# Patient Record
Sex: Female | Born: 1970 | Race: White | Hispanic: No | Marital: Married | Smoking: Current every day smoker
Health system: Southern US, Community
[De-identification: ages and names within clinical notes are randomized; demographics above are authoritative.]

## PROBLEM LIST (undated history)

## (undated) DIAGNOSIS — Z8709 Personal history of other diseases of the respiratory system: Secondary | ICD-10-CM

## (undated) DIAGNOSIS — R531 Weakness: Secondary | ICD-10-CM

## (undated) DIAGNOSIS — M2550 Pain in unspecified joint: Secondary | ICD-10-CM

## (undated) DIAGNOSIS — M2540 Effusion, unspecified joint: Secondary | ICD-10-CM

## (undated) DIAGNOSIS — R42 Dizziness and giddiness: Secondary | ICD-10-CM

## (undated) DIAGNOSIS — F419 Anxiety disorder, unspecified: Secondary | ICD-10-CM

## (undated) DIAGNOSIS — Z9889 Other specified postprocedural states: Secondary | ICD-10-CM

## (undated) DIAGNOSIS — Z8669 Personal history of other diseases of the nervous system and sense organs: Secondary | ICD-10-CM

## (undated) DIAGNOSIS — E785 Hyperlipidemia, unspecified: Secondary | ICD-10-CM

## (undated) DIAGNOSIS — F329 Major depressive disorder, single episode, unspecified: Secondary | ICD-10-CM

## (undated) DIAGNOSIS — M549 Dorsalgia, unspecified: Secondary | ICD-10-CM

## (undated) DIAGNOSIS — F32A Depression, unspecified: Secondary | ICD-10-CM

## (undated) DIAGNOSIS — E0590 Thyrotoxicosis, unspecified without thyrotoxic crisis or storm: Secondary | ICD-10-CM

## (undated) DIAGNOSIS — R351 Nocturia: Secondary | ICD-10-CM

## (undated) DIAGNOSIS — R350 Frequency of micturition: Secondary | ICD-10-CM

## (undated) DIAGNOSIS — M1990 Unspecified osteoarthritis, unspecified site: Secondary | ICD-10-CM

## (undated) DIAGNOSIS — M797 Fibromyalgia: Secondary | ICD-10-CM

## (undated) DIAGNOSIS — R112 Nausea with vomiting, unspecified: Secondary | ICD-10-CM

## (undated) DIAGNOSIS — G8929 Other chronic pain: Secondary | ICD-10-CM

## (undated) DIAGNOSIS — R413 Other amnesia: Secondary | ICD-10-CM

## (undated) DIAGNOSIS — J189 Pneumonia, unspecified organism: Secondary | ICD-10-CM

## (undated) DIAGNOSIS — K219 Gastro-esophageal reflux disease without esophagitis: Secondary | ICD-10-CM

## (undated) DIAGNOSIS — G4700 Insomnia, unspecified: Secondary | ICD-10-CM

## (undated) HISTORY — PX: WRIST SURGERY: SHX841

## (undated) HISTORY — PX: OTHER SURGICAL HISTORY: SHX169

---

## 2005-10-28 HISTORY — PX: CHOLECYSTECTOMY: SHX55

## 2005-10-28 HISTORY — PX: ABDOMINAL HYSTERECTOMY: SHX81

## 2005-10-28 HISTORY — PX: OOPHORECTOMY: SHX86

## 2012-10-28 HISTORY — PX: BACK SURGERY: SHX140

## 2014-08-22 ENCOUNTER — Other Ambulatory Visit (HOSPITAL_COMMUNITY): Payer: Self-pay | Admitting: Neurosurgery

## 2014-08-22 DIAGNOSIS — M5126 Other intervertebral disc displacement, lumbar region: Secondary | ICD-10-CM

## 2014-09-02 ENCOUNTER — Other Ambulatory Visit (HOSPITAL_COMMUNITY): Payer: Self-pay | Admitting: Neurosurgery

## 2014-09-09 ENCOUNTER — Inpatient Hospital Stay (HOSPITAL_COMMUNITY): Admission: RE | Admit: 2014-09-09 | Payer: Self-pay | Source: Ambulatory Visit

## 2014-09-09 ENCOUNTER — Ambulatory Visit (HOSPITAL_COMMUNITY): Payer: Medicare Other

## 2014-09-28 ENCOUNTER — Ambulatory Visit (HOSPITAL_COMMUNITY)
Admission: RE | Admit: 2014-09-28 | Discharge: 2014-09-28 | Disposition: A | Discharge status: Home / Self Care | Payer: Medicare Other | Source: Ambulatory Visit | Attending: Neurosurgery | Admitting: Neurosurgery

## 2014-09-28 ENCOUNTER — Encounter (HOSPITAL_COMMUNITY): Payer: Self-pay | Admitting: Pharmacy Technician

## 2014-09-28 VITALS — BP 107/61 | HR 56 | Temp 97.6°F | Resp 16 | Ht 62.0 in | Wt 158.0 lb

## 2014-09-28 DIAGNOSIS — M5126 Other intervertebral disc displacement, lumbar region: Secondary | ICD-10-CM | POA: Diagnosis present

## 2014-09-28 DIAGNOSIS — M5441 Lumbago with sciatica, right side: Secondary | ICD-10-CM

## 2014-09-28 DIAGNOSIS — M549 Dorsalgia, unspecified: Secondary | ICD-10-CM | POA: Diagnosis present

## 2014-09-28 DIAGNOSIS — M5442 Lumbago with sciatica, left side: Secondary | ICD-10-CM

## 2014-09-28 MED ORDER — DIAZEPAM 5 MG PO TABS
10.0000 mg | ORAL_TABLET | Freq: Once | ORAL | Status: AC
  Administered 2014-09-28: 10 mg via ORAL

## 2014-09-28 MED ORDER — ACETAMINOPHEN 325 MG PO TABS
ORAL_TABLET | ORAL | Status: AC
  Filled 2014-09-28: qty 2

## 2014-09-28 MED ORDER — ACETAMINOPHEN 325 MG PO TABS
650.0000 mg | ORAL_TABLET | Freq: Once | ORAL | Status: AC
  Administered 2014-09-28: 650 mg via ORAL

## 2014-09-28 MED ORDER — IOHEXOL 180 MG/ML  SOLN
20.0000 mL | Freq: Once | INTRAMUSCULAR | Status: AC | PRN
  Administered 2014-09-28: 20 mL via INTRATHECAL

## 2014-09-28 MED ORDER — TRAMADOL HCL 50 MG PO TABS
50.0000 mg | ORAL_TABLET | ORAL | Status: DC | PRN
  Administered 2014-09-28: 100 mg via ORAL
  Filled 2014-09-28 (×2): qty 2

## 2014-09-28 MED ORDER — ONDANSETRON HCL 4 MG/2ML IJ SOLN: 4.0000 mg | Freq: Four times a day (QID) | INTRAMUSCULAR | Status: DC | PRN

## 2014-09-28 MED ORDER — DIAZEPAM 5 MG PO TABS
ORAL_TABLET | ORAL | Status: AC
  Administered 2014-09-28: 10 mg via ORAL
  Filled 2014-09-28: qty 2

## 2014-09-28 NOTE — Op Note (Signed)
09/28/2014 Lumbar Myelogram  PATIENT:  Ebony HailJulie A Rudge is a 43 y.o. female  PRE-OPERATIVE DIAGNOSIS:  lumbago  POST-OPERATIVE DIAGNOSIS:  lumbago  PROCEDURE:  Lumbar Myelogram  SURGEON:  Arlene Brickel  ANESTHESIA:   local LOCAL MEDICATIONS USED:  LIDOCAINE  and Amount: 8 ml Procedure Note: Ebony HailJulie A Elsayed is a 43 y.o. female Was taken to the fluoroscopy suite and  positioned prone on the fluoroscopy table. Her back was prepared and draped in a sterile manner. I infiltrated 8 cc into the lumbar region. I then introduced a spinal needle into the thecal sac at the L4/5 interlaminar space. I infiltrated 20cc of Omnipaque 180 into the thecal sac. Fluoroscopy showed the needle and contrast in the thecal sac. Zenaida DeedJulie A Francom tolerated the procedure well. she Will be taken to CT for evaluation.     PATIENT DISPOSITION:  PACU - hemodynamically stable.

## 2014-09-28 NOTE — Discharge Instructions (Signed)
Myelography, Care After °These instructions give you information on caring for yourself after your procedure. Your doctor may also give you specific instructions. Call your doctor if you have any problems or questions after your procedure. °HOME CARE °· Rest often the first day. °· When you rest, lie flat, with your head slightly raised (elevated). °· Avoid heavy lifting and activity for 48 hours. °· You may take the bandage (dressing) off 1 day after the test. °GET HELP RIGHT AWAY IF:  °· You have a very bad headache. °· You have a fever. °MAKE SURE YOU: °· Understand these instructions. °· Will watch your condition. °· Will get help right away if you are not doing well or get worse. °Document Released: 07/23/2008 Document Revised: 02/28/2014 Document Reviewed: 02/02/2014 °ExitCare® Patient Information ©2015 [employer]. This information is not intended to replace advice given to you by your health care provider. Make sure you discuss any questions you have with your health care provider. ° ° °

## 2014-10-24 ENCOUNTER — Other Ambulatory Visit (HOSPITAL_COMMUNITY): Payer: Self-pay | Admitting: Neurosurgery

## 2014-11-10 ENCOUNTER — Encounter (HOSPITAL_COMMUNITY)
Admission: RE | Admit: 2014-11-10 | Discharge: 2014-11-10 | Disposition: A | Discharge status: Home / Self Care | Payer: Medicare Other | Source: Ambulatory Visit | Attending: Neurosurgery | Admitting: Neurosurgery

## 2014-11-10 ENCOUNTER — Encounter (HOSPITAL_COMMUNITY): Payer: Self-pay

## 2014-11-10 DIAGNOSIS — Z01812 Encounter for preprocedural laboratory examination: Secondary | ICD-10-CM | POA: Insufficient documentation

## 2014-11-10 DIAGNOSIS — M4806 Spinal stenosis, lumbar region: Secondary | ICD-10-CM | POA: Diagnosis not present

## 2014-11-10 DIAGNOSIS — Z01818 Encounter for other preprocedural examination: Secondary | ICD-10-CM | POA: Insufficient documentation

## 2014-11-10 HISTORY — DX: Pain in unspecified joint: M25.50

## 2014-11-10 HISTORY — DX: Frequency of micturition: R35.0

## 2014-11-10 HISTORY — DX: Other amnesia: R41.3

## 2014-11-10 HISTORY — DX: Personal history of other diseases of the respiratory system: Z87.09

## 2014-11-10 HISTORY — DX: Other specified postprocedural states: Z98.890

## 2014-11-10 HISTORY — DX: Effusion, unspecified joint: M25.40

## 2014-11-10 HISTORY — DX: Other chronic pain: G89.29

## 2014-11-10 HISTORY — DX: Dizziness and giddiness: R42

## 2014-11-10 HISTORY — DX: Nausea with vomiting, unspecified: R11.2

## 2014-11-10 HISTORY — DX: Insomnia, unspecified: G47.00

## 2014-11-10 HISTORY — DX: Dorsalgia, unspecified: M54.9

## 2014-11-10 HISTORY — DX: Thyrotoxicosis, unspecified without thyrotoxic crisis or storm: E05.90

## 2014-11-10 HISTORY — DX: Weakness: R53.1

## 2014-11-10 HISTORY — DX: Depression, unspecified: F32.A

## 2014-11-10 HISTORY — DX: Major depressive disorder, single episode, unspecified: F32.9

## 2014-11-10 HISTORY — DX: Pneumonia, unspecified organism: J18.9

## 2014-11-10 HISTORY — DX: Fibromyalgia: M79.7

## 2014-11-10 HISTORY — DX: Gastro-esophageal reflux disease without esophagitis: K21.9

## 2014-11-10 HISTORY — DX: Hyperlipidemia, unspecified: E78.5

## 2014-11-10 HISTORY — DX: Unspecified osteoarthritis, unspecified site: M19.90

## 2014-11-10 HISTORY — DX: Anxiety disorder, unspecified: F41.9

## 2014-11-10 HISTORY — DX: Personal history of other diseases of the nervous system and sense organs: Z86.69

## 2014-11-10 HISTORY — DX: Nocturia: R35.1

## 2014-11-10 LAB — BASIC METABOLIC PANEL
Anion gap: 5 (ref 5–15)
BUN: <5 mg/dL — ABNORMAL LOW (ref 6–23)
CALCIUM: 8.9 mg/dL (ref 8.4–10.5)
CO2: 23 mmol/L (ref 19–32)
CREATININE: 0.9 mg/dL (ref 0.50–1.10)
Chloride: 110 mEq/L (ref 96–112)
GFR calc Af Amer: 89 mL/min — ABNORMAL LOW (ref >90)
GFR calc non Af Amer: 77 mL/min — ABNORMAL LOW (ref >90)
GLUCOSE: 107 mg/dL — AB (ref 70–99)
Potassium: 4 mmol/L (ref 3.5–5.1)
Sodium: 138 mmol/L (ref 135–145)

## 2014-11-10 LAB — CBC
HEMATOCRIT: 42.1 % (ref 36.0–46.0)
Hemoglobin: 14 g/dL (ref 12.0–15.0)
MCH: 27.3 pg (ref 26.0–34.0)
MCHC: 33.3 g/dL (ref 30.0–36.0)
MCV: 82.2 fL (ref 78.0–100.0)
Platelets: 333 10*3/uL (ref 150–400)
RBC: 5.12 MIL/uL — AB (ref 3.87–5.11)
RDW: 14.3 % (ref 11.5–15.5)
WBC: 11.9 10*3/uL — ABNORMAL HIGH (ref 4.0–10.5)

## 2014-11-10 LAB — SURGICAL PCR SCREEN
MRSA, PCR: NEGATIVE
Staphylococcus aureus: NEGATIVE

## 2014-11-10 NOTE — Pre-Procedure Instructions (Signed)
Ebony HailJulie A Hornak  11/10/2014   Your procedure is scheduled on:  Thurs, Jan 21  @ 1:20 PM  [facility]  at 10:15 AM.  Call this number if you have problems the morning of surgery: [phone]   Remember:   Do not eat food or drink liquids after midnight.   Take these medicines the morning of surgery with A SIP OF WATER: Citalopram(Celexa),Valium(Diazepam),Nexium(Esomeprazole),and Requip(Ropinirole)              Stop taking your Ibuprofen a week before surgery. No Goody's,BC's,Aleve,Aspirin,Fish Oil,or any Herbal Medications   Do not wear jewelry, make-up or nail polish.  Do not wear lotions, powders, or perfumes. You may wear deodorant.  Do not shave 48 hours prior to surgery.   Do not bring valuables to the hospital.  [facility] is not responsible                  for any belongings or valuables.               Contacts, dentures or bridgework may not be worn into surgery.  Leave suitcase in the car. After surgery it may be brought to your room.  For patients admitted to the hospital, discharge time is determined by your                treatment team.               Patients discharged the day of surgery will not be allowed to drive  home.    Special Instructions:  [facility] - Preparing for Surgery  Before surgery, you can play an important role.  Because skin is not sterile, your skin needs to be as free of germs as possible.  You can reduce the number of germs on you skin by washing with CHG (chlorahexidine gluconate) soap before surgery.  CHG is an antiseptic cleaner which kills germs and bonds with the skin to continue killing germs even after washing.  Please DO NOT use if you have an allergy to CHG or antibacterial soaps.  If your skin becomes reddened/irritated stop using the CHG and inform your nurse when you arrive at Short Stay.  Do not shave (including legs and underarms) for at least 48 hours prior to the first CHG shower.  You may shave your  face.  Please follow these instructions carefully:   1.  Shower with CHG Soap the night before surgery and the                                morning of Surgery.  2.  If you choose to wash your hair, wash your hair first as usual with your       normal shampoo.  3.  After you shampoo, rinse your hair and body thoroughly to remove the                      Shampoo.  4.  Use CHG as you would any other liquid soap.  You can apply chg directly       to the skin and wash gently with scrungie or a clean washcloth.  5.  Apply the CHG Soap to your body ONLY FROM THE NECK DOWN.        Do not use on open wounds or open sores.  Avoid contact with your eyes,  ears, mouth and genitals (private parts).  Wash genitals (private parts)       with your normal soap.  6.  Wash thoroughly, paying special attention to the area where your surgery        will be performed.  7.  Thoroughly rinse your body with warm water from the neck down.  8.  DO NOT shower/wash with your normal soap after using and rinsing off       the CHG Soap.  9.  Pat yourself dry with a clean towel.            10.  Wear clean pajamas.            11.  Place clean sheets on your bed the night of your first shower and do not        sleep with pets.  Day of Surgery  Do not apply any lotions/deoderants the morning of surgery.  Please wear clean clothes to the hospital/surgery center.     Please read over the following fact sheets that you were given: Pain Booklet, Coughing and Deep Breathing, MRSA Information and Surgical Site Infection Prevention

## 2014-11-10 NOTE — Progress Notes (Signed)
Pt doesn't have a cardiologist  Stress test in 2008-d/t anxiety/panic attacks  Denies ever having an echo or heart cath  [facility] [facility]  Denies EKG or CXR in past yr

## 2014-11-16 MED ORDER — CEFAZOLIN SODIUM-DEXTROSE 2-3 GM-% IV SOLR
2.0000 g | INTRAVENOUS | Status: AC
  Administered 2014-11-17: 2 g via INTRAVENOUS
  Filled 2014-11-16: qty 50

## 2014-11-16 NOTE — Progress Notes (Signed)
Patient called Short Stay and voiced concern about medications she was instructed to take DOS. Patient stated "I am suppose to take these medicines but if I don't take them with food shortly after then I will get sick. So should I just take the medicines any way?" Nurse reviewed medications with patient and informed patient that if the medications were going to make her sick then she should only take the Nexium the DOS. Patient stated "yes I definitely have to take the Nexium if I don't take anything else." Patient thanked Nurse for help. Call ended.

## 2014-11-17 ENCOUNTER — Encounter (HOSPITAL_COMMUNITY): Payer: Self-pay | Admitting: Anesthesiology

## 2014-11-17 ENCOUNTER — Inpatient Hospital Stay (HOSPITAL_COMMUNITY): Payer: Medicare Other | Admitting: Anesthesiology

## 2014-11-17 ENCOUNTER — Ambulatory Visit (HOSPITAL_COMMUNITY)
Admission: RE | Admit: 2014-11-17 | Discharge: 2014-11-18 | Disposition: A | Discharge status: Home / Self Care | Payer: Medicare Other | Source: Ambulatory Visit | Attending: Neurosurgery | Admitting: Neurosurgery

## 2014-11-17 ENCOUNTER — Encounter (HOSPITAL_COMMUNITY)
Admission: RE | Disposition: A | Discharge status: Home / Self Care | Payer: Self-pay | Source: Ambulatory Visit | Attending: Neurosurgery

## 2014-11-17 ENCOUNTER — Inpatient Hospital Stay (HOSPITAL_COMMUNITY): Payer: Medicare Other

## 2014-11-17 DIAGNOSIS — K219 Gastro-esophageal reflux disease without esophagitis: Secondary | ICD-10-CM | POA: Insufficient documentation

## 2014-11-17 DIAGNOSIS — F419 Anxiety disorder, unspecified: Secondary | ICD-10-CM | POA: Insufficient documentation

## 2014-11-17 DIAGNOSIS — Z885 Allergy status to narcotic agent status: Secondary | ICD-10-CM | POA: Diagnosis not present

## 2014-11-17 DIAGNOSIS — M1990 Unspecified osteoarthritis, unspecified site: Secondary | ICD-10-CM | POA: Diagnosis not present

## 2014-11-17 DIAGNOSIS — E119 Type 2 diabetes mellitus without complications: Secondary | ICD-10-CM | POA: Insufficient documentation

## 2014-11-17 DIAGNOSIS — E0590 Thyrotoxicosis, unspecified without thyrotoxic crisis or storm: Secondary | ICD-10-CM | POA: Insufficient documentation

## 2014-11-17 DIAGNOSIS — Z888 Allergy status to other drugs, medicaments and biological substances status: Secondary | ICD-10-CM | POA: Diagnosis not present

## 2014-11-17 DIAGNOSIS — M5126 Other intervertebral disc displacement, lumbar region: Principal | ICD-10-CM | POA: Diagnosis present

## 2014-11-17 DIAGNOSIS — M797 Fibromyalgia: Secondary | ICD-10-CM | POA: Diagnosis not present

## 2014-11-17 DIAGNOSIS — IMO0002 Reserved for concepts with insufficient information to code with codable children: Secondary | ICD-10-CM

## 2014-11-17 DIAGNOSIS — F17210 Nicotine dependence, cigarettes, uncomplicated: Secondary | ICD-10-CM | POA: Diagnosis not present

## 2014-11-17 HISTORY — PX: LUMBAR LAMINECTOMY/DECOMPRESSION MICRODISCECTOMY: SHX5026

## 2014-11-17 SURGERY — LUMBAR LAMINECTOMY/DECOMPRESSION MICRODISCECTOMY 1 LEVEL
Anesthesia: General | Site: Back | Laterality: Left

## 2014-11-17 MED ORDER — SODIUM CHLORIDE 0.9 % IV SOLN: 250.0000 mL | INTRAVENOUS | Status: DC

## 2014-11-17 MED ORDER — NEOSTIGMINE METHYLSULFATE 10 MG/10ML IV SOLN
INTRAVENOUS | Status: DC | PRN
  Administered 2014-11-17: 5 mg via INTRAVENOUS

## 2014-11-17 MED ORDER — ROCURONIUM BROMIDE 100 MG/10ML IV SOLN
INTRAVENOUS | Status: DC | PRN
  Administered 2014-11-17: 50 mg via INTRAVENOUS

## 2014-11-17 MED ORDER — PANTOPRAZOLE SODIUM 40 MG PO TBEC: 80.0000 mg | DELAYED_RELEASE_TABLET | Freq: Every day | ORAL | Status: DC

## 2014-11-17 MED ORDER — FENTANYL CITRATE 0.05 MG/ML IJ SOLN
INTRAMUSCULAR | Status: DC | PRN
  Administered 2014-11-17: 100 ug via INTRAVENOUS

## 2014-11-17 MED ORDER — MORPHINE SULFATE 15 MG PO TABS
15.0000 mg | ORAL_TABLET | ORAL | Status: DC | PRN
  Administered 2014-11-17 – 2014-11-18 (×3): 15 mg via ORAL
  Filled 2014-11-17 (×3): qty 1

## 2014-11-17 MED ORDER — HYDROMORPHONE HCL 1 MG/ML IJ SOLN
INTRAMUSCULAR | Status: AC
  Filled 2014-11-17: qty 1

## 2014-11-17 MED ORDER — ONDANSETRON HCL 4 MG/2ML IJ SOLN
INTRAMUSCULAR | Status: DC | PRN
  Administered 2014-11-17 (×2): 4 mg via INTRAVENOUS

## 2014-11-17 MED ORDER — ONDANSETRON HCL 4 MG/2ML IJ SOLN
INTRAMUSCULAR | Status: AC
  Filled 2014-11-17: qty 2

## 2014-11-17 MED ORDER — PREGABALIN 50 MG PO CAPS
75.0000 mg | ORAL_CAPSULE | Freq: Two times a day (BID) | ORAL | Status: DC
  Filled 2014-11-17 (×2): qty 1

## 2014-11-17 MED ORDER — MENTHOL 3 MG MT LOZG: 1.0000 | LOZENGE | OROMUCOSAL | Status: DC | PRN

## 2014-11-17 MED ORDER — ESTRADIOL 1 MG PO TABS
1.0000 mg | ORAL_TABLET | Freq: Every day | ORAL | Status: DC
  Administered 2014-11-17: 1 mg via ORAL
  Filled 2014-11-17 (×2): qty 1

## 2014-11-17 MED ORDER — POLYETHYLENE GLYCOL 3350 17 G PO PACK
17.0000 g | PACK | Freq: Every day | ORAL | Status: DC | PRN
  Filled 2014-11-17: qty 1

## 2014-11-17 MED ORDER — LIDOCAINE HCL 4 % MT SOLN
OROMUCOSAL | Status: DC | PRN
Start: 2014-11-17 — End: 2014-11-17
  Administered 2014-11-17: 4 mL via TOPICAL

## 2014-11-17 MED ORDER — PROPOFOL 10 MG/ML IV BOLUS
INTRAVENOUS | Status: DC | PRN
  Administered 2014-11-17: 200 mg via INTRAVENOUS

## 2014-11-17 MED ORDER — GLYCOPYRROLATE 0.2 MG/ML IJ SOLN
INTRAMUSCULAR | Status: AC
  Filled 2014-11-17: qty 3

## 2014-11-17 MED ORDER — GLYCOPYRROLATE 0.2 MG/ML IJ SOLN
INTRAMUSCULAR | Status: DC | PRN
  Administered 2014-11-17: 1 mg via INTRAVENOUS

## 2014-11-17 MED ORDER — LACTATED RINGERS IV SOLN
INTRAVENOUS | Status: DC
  Administered 2014-11-17: 50 mL/h via INTRAVENOUS

## 2014-11-17 MED ORDER — PSEUDOEPHEDRINE HCL 60 MG PO TABS: 60.0000 mg | ORAL_TABLET | ORAL | Status: DC | PRN

## 2014-11-17 MED ORDER — POTASSIUM CHLORIDE IN NACL 20-0.9 MEQ/L-% IV SOLN
INTRAVENOUS | Status: DC
  Filled 2014-11-17 (×3): qty 1000

## 2014-11-17 MED ORDER — THROMBIN 5000 UNITS EX SOLR
CUTANEOUS | Status: DC | PRN
  Administered 2014-11-17 (×2): 5000 [IU] via TOPICAL

## 2014-11-17 MED ORDER — POLYVINYL ALCOHOL 1.4 % OP SOLN
1.0000 [drp] | OPHTHALMIC | Status: DC | PRN
  Filled 2014-11-17: qty 15

## 2014-11-17 MED ORDER — FENTANYL CITRATE 0.05 MG/ML IJ SOLN
INTRAMUSCULAR | Status: AC
  Filled 2014-11-17: qty 2

## 2014-11-17 MED ORDER — ZOLPIDEM TARTRATE 5 MG PO TABS: 5.0000 mg | ORAL_TABLET | Freq: Every day | ORAL | Status: DC

## 2014-11-17 MED ORDER — LACTATED RINGERS IV SOLN
INTRAVENOUS | Status: DC | PRN
  Administered 2014-11-17 (×2): via INTRAVENOUS

## 2014-11-17 MED ORDER — DIAZEPAM 5 MG PO TABS
5.0000 mg | ORAL_TABLET | Freq: Four times a day (QID) | ORAL | Status: DC | PRN
  Administered 2014-11-17 – 2014-11-18 (×3): 5 mg via ORAL
  Filled 2014-11-17 (×2): qty 1

## 2014-11-17 MED ORDER — DULOXETINE HCL 30 MG PO CPEP
30.0000 mg | ORAL_CAPSULE | Freq: Every day | ORAL | Status: DC
  Administered 2014-11-17: 30 mg via ORAL
  Filled 2014-11-17 (×3): qty 1

## 2014-11-17 MED ORDER — ACETAMINOPHEN 650 MG RE SUPP: 650.0000 mg | RECTAL | Status: DC | PRN

## 2014-11-17 MED ORDER — ROCURONIUM BROMIDE 50 MG/5ML IV SOLN
INTRAVENOUS | Status: AC
  Filled 2014-11-17: qty 1

## 2014-11-17 MED ORDER — FENTANYL 25 MCG/HR TD PT72: 25.0000 ug | MEDICATED_PATCH | TRANSDERMAL | Status: DC

## 2014-11-17 MED ORDER — KETOROLAC TROMETHAMINE 30 MG/ML IJ SOLN
30.0000 mg | Freq: Four times a day (QID) | INTRAMUSCULAR | Status: DC
  Administered 2014-11-17 – 2014-11-18 (×2): 30 mg via INTRAVENOUS
  Filled 2014-11-17 (×5): qty 1

## 2014-11-17 MED ORDER — FENTANYL CITRATE 0.05 MG/ML IJ SOLN
INTRAMUSCULAR | Status: DC | PRN
  Administered 2014-11-17 (×5): 50 ug via INTRAVENOUS

## 2014-11-17 MED ORDER — EZETIMIBE 10 MG PO TABS
10.0000 mg | ORAL_TABLET | Freq: Every day | ORAL | Status: DC
  Administered 2014-11-17: 10 mg via ORAL
  Filled 2014-11-17 (×2): qty 1

## 2014-11-17 MED ORDER — LIDOCAINE HCL (CARDIAC) 20 MG/ML IV SOLN
INTRAVENOUS | Status: DC | PRN
  Administered 2014-11-17: 80 mg via INTRAVENOUS
  Administered 2014-11-17: 20 mg via INTRAVENOUS

## 2014-11-17 MED ORDER — DIPHENHYDRAMINE HCL 50 MG/ML IJ SOLN
INTRAMUSCULAR | Status: AC
  Filled 2014-11-17: qty 1

## 2014-11-17 MED ORDER — TOPIRAMATE 100 MG PO TABS
100.0000 mg | ORAL_TABLET | Freq: Two times a day (BID) | ORAL | Status: DC
  Administered 2014-11-17: 100 mg via ORAL
  Filled 2014-11-17 (×3): qty 1

## 2014-11-17 MED ORDER — ACETAMINOPHEN 325 MG PO TABS: 650.0000 mg | ORAL_TABLET | ORAL | Status: DC | PRN

## 2014-11-17 MED ORDER — DEXAMETHASONE SODIUM PHOSPHATE 4 MG/ML IJ SOLN
INTRAMUSCULAR | Status: DC | PRN
  Administered 2014-11-17: 4 mg via INTRAVENOUS

## 2014-11-17 MED ORDER — PHENOL 1.4 % MT LIQD: 1.0000 | OROMUCOSAL | Status: DC | PRN

## 2014-11-17 MED ORDER — ONDANSETRON HCL 4 MG/2ML IJ SOLN: 4.0000 mg | Freq: Once | INTRAMUSCULAR | Status: DC | PRN

## 2014-11-17 MED ORDER — ARTIFICIAL TEARS OP OINT
TOPICAL_OINTMENT | OPHTHALMIC | Status: DC | PRN
  Administered 2014-11-17: 1 via OPHTHALMIC

## 2014-11-17 MED ORDER — DIPHENHYDRAMINE HCL 50 MG/ML IJ SOLN
6.2500 mg | Freq: Once | INTRAMUSCULAR | Status: AC
  Administered 2014-11-17: 6.5 mg via INTRAVENOUS

## 2014-11-17 MED ORDER — HEMOSTATIC AGENTS (NO CHARGE) OPTIME
TOPICAL | Status: DC | PRN
  Administered 2014-11-17: 1 via TOPICAL

## 2014-11-17 MED ORDER — ONDANSETRON HCL 4 MG/2ML IJ SOLN
4.0000 mg | INTRAMUSCULAR | Status: DC | PRN
  Administered 2014-11-18: 4 mg via INTRAVENOUS
  Filled 2014-11-17: qty 2

## 2014-11-17 MED ORDER — FENTANYL CITRATE 0.05 MG/ML IJ SOLN
INTRAMUSCULAR | Status: AC
  Filled 2014-11-17: qty 5

## 2014-11-17 MED ORDER — SODIUM CHLORIDE 0.9 % IJ SOLN: 3.0000 mL | INTRAMUSCULAR | Status: DC | PRN

## 2014-11-17 MED ORDER — METHYLPREDNISOLONE ACETATE 80 MG/ML IJ SUSP
INTRAMUSCULAR | Status: DC | PRN
  Administered 2014-11-17: 80 mg

## 2014-11-17 MED ORDER — SODIUM CHLORIDE 0.9 % IJ SOLN
3.0000 mL | Freq: Two times a day (BID) | INTRAMUSCULAR | Status: DC
  Administered 2014-11-17: 3 mL via INTRAVENOUS

## 2014-11-17 MED ORDER — DIAZEPAM 5 MG PO TABS
ORAL_TABLET | ORAL | Status: AC
  Filled 2014-11-17: qty 1

## 2014-11-17 MED ORDER — ARTIFICIAL TEARS OP OINT
TOPICAL_OINTMENT | OPHTHALMIC | Status: AC
  Filled 2014-11-17: qty 3.5

## 2014-11-17 MED ORDER — SENNA 8.6 MG PO TABS
1.0000 | ORAL_TABLET | Freq: Two times a day (BID) | ORAL | Status: DC
  Administered 2014-11-17: 8.6 mg via ORAL
  Filled 2014-11-17 (×3): qty 1

## 2014-11-17 MED ORDER — LIDOCAINE-EPINEPHRINE 0.5 %-1:200000 IJ SOLN
INTRAMUSCULAR | Status: DC | PRN
  Administered 2014-11-17: 20 mL

## 2014-11-17 MED ORDER — SCOPOLAMINE 1 MG/3DAYS TD PT72
MEDICATED_PATCH | TRANSDERMAL | Status: AC
  Administered 2014-11-17: 1.5 mg
  Filled 2014-11-17: qty 1

## 2014-11-17 MED ORDER — ROPINIROLE HCL 0.5 MG PO TABS
0.5000 mg | ORAL_TABLET | Freq: Every day | ORAL | Status: DC
  Administered 2014-11-17: 0.5 mg via ORAL
  Filled 2014-11-17 (×2): qty 1

## 2014-11-17 MED ORDER — HYDROMORPHONE HCL 1 MG/ML IJ SOLN
0.2500 mg | INTRAMUSCULAR | Status: DC | PRN
  Administered 2014-11-17 (×4): 0.5 mg via INTRAVENOUS

## 2014-11-17 MED ORDER — SUMATRIPTAN SUCCINATE 50 MG PO TABS
50.0000 mg | ORAL_TABLET | ORAL | Status: DC | PRN
  Filled 2014-11-17: qty 1

## 2014-11-17 MED ORDER — GABAPENTIN 300 MG PO CAPS
300.0000 mg | ORAL_CAPSULE | Freq: Three times a day (TID) | ORAL | Status: DC
  Administered 2014-11-17: 300 mg via ORAL
  Filled 2014-11-17 (×4): qty 1

## 2014-11-17 MED ORDER — ALUM & MAG HYDROXIDE-SIMETH 200-200-20 MG/5ML PO SUSP: 30.0000 mL | Freq: Four times a day (QID) | ORAL | Status: DC | PRN

## 2014-11-17 MED ORDER — 0.9 % SODIUM CHLORIDE (POUR BTL) OPTIME
TOPICAL | Status: DC | PRN
  Administered 2014-11-17: 1000 mL

## 2014-11-17 MED ORDER — PROPYLENE GLYCOL 0.6 % OP SOLN: 1.0000 [drp] | Freq: Two times a day (BID) | OPHTHALMIC | Status: DC | PRN

## 2014-11-17 SURGICAL SUPPLY — 53 items
APL SKNCLS STERI-STRIP NONHPOA (GAUZE/BANDAGES/DRESSINGS)
BAG DECANTER FOR FLEXI CONT (MISCELLANEOUS) ×3 IMPLANT
BENZOIN TINCTURE PRP APPL 2/3 (GAUZE/BANDAGES/DRESSINGS) IMPLANT
BLADE CLIPPER SURG (BLADE) IMPLANT
BUR MATCHSTICK NEURO 3.0 LAGG (BURR) ×3 IMPLANT
CANISTER SUCT 3000ML (MISCELLANEOUS) ×3 IMPLANT
CLOSURE WOUND 1/2 X4 (GAUZE/BANDAGES/DRESSINGS)
CONT SPEC 4OZ CLIKSEAL STRL BL (MISCELLANEOUS) ×3 IMPLANT
DECANTER SPIKE VIAL GLASS SM (MISCELLANEOUS) ×3 IMPLANT
DRAPE LAPAROTOMY 100X72X124 (DRAPES) ×3 IMPLANT
DRAPE MICROSCOPE LEICA (MISCELLANEOUS) ×3 IMPLANT
DRAPE POUCH INSTRU U-SHP 10X18 (DRAPES) ×3 IMPLANT
DRAPE SURG 17X23 STRL (DRAPES) ×3 IMPLANT
DURAPREP 26ML APPLICATOR (WOUND CARE) ×3 IMPLANT
ELECT REM PT RETURN 9FT ADLT (ELECTROSURGICAL) ×3
ELECTRODE REM PT RTRN 9FT ADLT (ELECTROSURGICAL) ×1 IMPLANT
GAUZE SPONGE 4X4 12PLY STRL (GAUZE/BANDAGES/DRESSINGS) IMPLANT
GAUZE SPONGE 4X4 16PLY XRAY LF (GAUZE/BANDAGES/DRESSINGS) IMPLANT
GLOVE BIOGEL PI IND STRL 7.0 (GLOVE) IMPLANT
GLOVE BIOGEL PI INDICATOR 7.0 (GLOVE) ×2
GLOVE ECLIPSE 6.5 STRL STRAW (GLOVE) ×3 IMPLANT
GLOVE ECLIPSE 7.0 STRL STRAW (GLOVE) ×2 IMPLANT
GLOVE EXAM NITRILE LRG STRL (GLOVE) IMPLANT
GLOVE EXAM NITRILE MD LF STRL (GLOVE) IMPLANT
GLOVE EXAM NITRILE XL STR (GLOVE) IMPLANT
GLOVE EXAM NITRILE XS STR PU (GLOVE) IMPLANT
GOWN STRL REUS W/ TWL LRG LVL3 (GOWN DISPOSABLE) ×2 IMPLANT
GOWN STRL REUS W/ TWL XL LVL3 (GOWN DISPOSABLE) IMPLANT
GOWN STRL REUS W/TWL 2XL LVL3 (GOWN DISPOSABLE) IMPLANT
GOWN STRL REUS W/TWL LRG LVL3 (GOWN DISPOSABLE) ×6
GOWN STRL REUS W/TWL XL LVL3 (GOWN DISPOSABLE) ×3
KIT BASIN OR (CUSTOM PROCEDURE TRAY) ×3 IMPLANT
KIT ROOM TURNOVER OR (KITS) ×3 IMPLANT
LIQUID BAND (GAUZE/BANDAGES/DRESSINGS) ×3 IMPLANT
NDL HYPO 25X1 1.5 SAFETY (NEEDLE) ×1 IMPLANT
NDL SPNL 18GX3.5 QUINCKE PK (NEEDLE) IMPLANT
NEEDLE HYPO 25X1 1.5 SAFETY (NEEDLE) ×3 IMPLANT
NEEDLE SPNL 18GX3.5 QUINCKE PK (NEEDLE) IMPLANT
NS IRRIG 1000ML POUR BTL (IV SOLUTION) ×3 IMPLANT
PACK LAMINECTOMY NEURO (CUSTOM PROCEDURE TRAY) ×3 IMPLANT
PAD ARMBOARD 7.5X6 YLW CONV (MISCELLANEOUS) ×9 IMPLANT
RUBBERBAND STERILE (MISCELLANEOUS) ×6 IMPLANT
SPONGE LAP 4X18 X RAY DECT (DISPOSABLE) IMPLANT
SPONGE SURGIFOAM ABS GEL SZ50 (HEMOSTASIS) ×3 IMPLANT
STRIP CLOSURE SKIN 1/2X4 (GAUZE/BANDAGES/DRESSINGS) IMPLANT
SUT VIC AB 0 CT1 18XCR BRD8 (SUTURE) ×1 IMPLANT
SUT VIC AB 0 CT1 8-18 (SUTURE) ×3
SUT VIC AB 2-0 CT1 18 (SUTURE) ×3 IMPLANT
SUT VIC AB 3-0 SH 8-18 (SUTURE) ×3 IMPLANT
SYR 20ML ECCENTRIC (SYRINGE) ×3 IMPLANT
TOWEL OR 17X24 6PK STRL BLUE (TOWEL DISPOSABLE) ×3 IMPLANT
TOWEL OR 17X26 10 PK STRL BLUE (TOWEL DISPOSABLE) ×3 IMPLANT
WATER STERILE IRR 1000ML POUR (IV SOLUTION) ×3 IMPLANT

## 2014-11-17 NOTE — Transfer of Care (Signed)
Immediate Anesthesia Transfer of Care Note  Patient: Claire Ritter  Procedure(s) Performed: Procedure(s) with comments: LUMBAR LAMINECTOMY/DECOMPRESSION MICRODISCECTOMY 1 LEVEL (Left) - Left L45 far lateral microdiskeectomy  Patient Location: PACU  Anesthesia Type:General  Level of Consciousness: awake and alert   Airway & Oxygen Therapy: Patient Spontanous Breathing and Patient connected to nasal cannula oxygen  Post-op Assessment: Report given to PACU RN, Post -op Vital signs reviewed and stable and Patient moving all extremities X 4  Post vital signs: Reviewed and stable  Complications: No apparent anesthesia complications

## 2014-11-17 NOTE — Anesthesia Preprocedure Evaluation (Addendum)
Anesthesia Evaluation  Patient identified by MRN, date of birth, ID band Patient awake    Reviewed: Allergy & Precautions, NPO status , Patient's Chart, lab work & pertinent test results  History of Anesthesia Complications (+) PONV  Airway        Dental   Pulmonary Current Smoker,          Cardiovascular     Neuro/Psych Anxiety  Neuromuscular disease    GI/Hepatic GERD-  ,  Endo/Other  Hyperthyroidism   Renal/GU      Musculoskeletal  (+) Arthritis -, Fibromyalgia -  Abdominal   Peds  Hematology   Anesthesia Other Findings   Reproductive/Obstetrics                            Anesthesia Physical Anesthesia Plan  ASA: I  Anesthesia Plan: General   Post-op Pain Management:    Induction: Intravenous  Airway Management Planned: Oral ETT  Additional Equipment:   Intra-op Plan:   Post-operative Plan: Extubation in OR  Informed Consent: I have reviewed the patients History and Physical, chart, labs and discussed the procedure including the risks, benefits and alternatives for the proposed anesthesia with the patient or authorized representative who has indicated his/her understanding and acceptance.     Plan Discussed with: CRNA, Anesthesiologist and Surgeon  Anesthesia Plan Comments:         Anesthesia Quick Evaluation

## 2014-11-17 NOTE — Anesthesia Procedure Notes (Signed)
Procedure Name: Intubation Date/Time: 11/17/2014 2:53 PM Performed by: Fransisca KaufmannMEYER, Montserrat Shek E Pre-anesthesia Checklist: Patient identified, Emergency Drugs available, Suction available, Patient being monitored and Timeout performed Patient Re-evaluated:Patient Re-evaluated prior to inductionOxygen Delivery Method: Circle system utilized Preoxygenation: Pre-oxygenation with 100% oxygen Intubation Type: IV induction Ventilation: Mask ventilation without difficulty Laryngoscope Size: Miller and 2 Grade View: Grade I Tube type: Oral Number of attempts: 1 Airway Equipment and Method: Stylet Placement Confirmation: ETT inserted through vocal cords under direct vision,  positive ETCO2 and breath sounds checked- equal and bilateral Secured at: 22 cm Tube secured with: Tape Dental Injury: Teeth and Oropharynx as per pre-operative assessment

## 2014-11-17 NOTE — Op Note (Signed)
11/17/2014  5:43 PM  PATIENT:  Ebony HailJulie A Haddix  44 y.o. female  PRE-OPERATIVE DIAGNOSIS:  lumbar herniated disc far lateral L4/5 left  POST-OPERATIVE DIAGNOSIS:  lumbar herniated disc far lateral L4/5 left  PROCEDURE:  Procedure(s): LUMBAR LAMINECTOMY/DECOMPRESSION  Far lateral MICRODISCECTOMY 1 LEVEL Left L4/5  SURGEON:   Surgeon(s): Coletta MemosKyle Tierre Gerard, MD Barnett AbuHenry Elsner, MD  ASSISTANTS:Elsner, Sherilyn CooterHenry  ANESTHESIA:   general  EBL:  Total I/O In: 1000 [I.V.:1000] Out: -   BLOOD ADMINISTERED:none  CELL SAVER GIVEN:none  COUNT:per nursing  DRAINS: none   SPECIMEN:  No Specimen  DICTATION: Mrs. Dan HumphreysWalker was taken to the operating room, intubated and placed under a general anesthetic without difficulty. She was positioned prone on a Wilson frame with all pressure points padded. Her back was prepped and draped in a sterile manner. I opened the skin with a 10 blade and carried the dissection down to the thoracolumbar fascia. I used both sharp dissection and the monopolar cautery to expose the lamina of L4, and L3. I confirmed my location with an intraoperative xray.  I used the drill, Kerrison punches, and curettes to drill the lateral aspect of the pars at  L4. I used the punches to remove the ligamentum flavum to expose the left L4 root. I brought the microscope into the operative field and with Dr.Elsner's assistance we started our decompression of the  L4 root(s). I cauterized epidural veins overlying the disc space then divided them sharply. I opened the disc space with a 11 blade and proceeded with the discectomy. I used pituitary rongeurs, curettes, and other instruments to remove disc material, and to work underneath the nerve root removing what was a calcified mass sitting underneath the root. After the discectomy was completed we inspected the L4 nerve root and felt it was well decompressed. I explored rostrally, laterally, medially, and caudally and was satisfied with the decompression. I  irrigated the wound,irrigated the nerve root with depomedrol and fentanyl  then closed in layers. I approximated the thoracolumbar fascia, subcutaneous, and subcuticular planes with vicryl sutures. I used dermabond for a sterile dressing.   PLAN OF CARE: Admit for overnight observation  PATIENT DISPOSITION:  PACU - hemodynamically stable.   Delay start of Pharmacological VTE agent (>24hrs) due to surgical blood loss or risk of bleeding:  yes

## 2014-11-17 NOTE — H&P (Signed)
BP 147/65 mmHg  Pulse 58  Temp(Src) 97.7 F (36.5 C) (Oral)  Resp 18  SpO2 100% Today, I saw Claire Ritter in the office.  As you know, she continues to have pain after a far lateral discectomy performed in May of 2014.  She said that that surgery helped to some degree, but never helped so much that she was out of pain.  She had originally popped her back when she was bending over to get some clothes in February of 2014.  At that time, a large far lateral disc herniation on the left side at L4-5 was identified.  She was eventually taken to [facility].  She has had continued pain since then.  She has undergone injections.  She has had physical therapy and has had medications.  She has had no bowel or bladder dysfunction and she comes in today for a second opinion.  She states that the left lower extremity remains numb.  The leg and foot will go numb frequently, it is very hard to drive when she presses on in the right foot, and then she will start to get some right lower extremity pain.  She is unable to put pressure on the left buttocks because it was causing too much pain.  She is 44 years of age.  She is right handed.  She feels pain in the leg and back, weakness in the left lower extremity, pain, numbness, and tingling and right lower extremity pain and numbness.  She has fainted in the past.  She has jerks a few times a day, no convulsants or seizures.  She said she does have headaches.     Past medical history:  Includes diabetes which she says is borderline and reflux.     Family history:  Mother is 6371 in poor health with heart disease, osteoporosis, hypertension, diabetes, degenerative disc disease.  Father died at age 44 with stroke, congestive heart failure and diabetes.  Depression also runs in the family history.     Past surgical history:  She has undergone a discectomy, cholecystectomy, and a hysterectomy.     Allergies:  SHE IS ALLERGIC TO OXYCODONE, HYDROCODONE,  AND NUCYNTA, ALL OF WHICH CAUSE ITCHING AND HIVES.     Current medications:  Ambien, Celexa, Valium, ropinirole, Topamax, Nexium, Maxalt, and fentanyl patch.     Social history:  She does smoke.  She does not use alcohol.  She does not use illicit drugs.     Review of systems:  Positive for night sweats, eyeglasses, balance problems, hypertension, leg pain with walking, leg weakness, back pain, leg pain, memory problems, problems with orientation, difficulty concentrating, coordination, difficulty in arms and legs, anxiety and depression.  She denies allergic, hematologic, endocrine, genitourinary, gastrointestinal, respiratory, or skin problems.  She says she has a hard time remembering things from a few years ago to yesterday.  She has poor coordination and late in her words, I tell them to go one way and sometimes they just do not listen.  She thinks that the pain in her lower extremities is causing the anxiety and depression.  On her pain chart, she lists pain in the thighs bilaterally and in the leg only on the left side and across her lower back.     Physical examination:  Vital signs are as follows:  Height 5 feet 1-1/2 inches.  Weight 162.2 pounds.  Blood pressure 97/64, pulse 71, respiratory rate 15, and temperature is 98.4.  Pain is 7/10.  On exam, alert and oriented x4, obvious distress.  She has give way weakness in the left thigh adductors.  She has some give way weakness in the left hip flexors.  Good strength in the hip extensors bilaterally.  Dorsiflexion and plantar flexion is okay.  She can walk in her heel.  She can walk in her toes.  Romberg is negative.  She can tandem walk, that was done very slowly.  Muscle tone, bulk, and coordination is normal.  Reflexes 2+ at the knees and ankles.  Downgoing toes to planar stimulation.  Proprioception is intact.  Light touch is intact.  She has a markedly antalgic gait.     Data:  MRI was reviewed from October 2015 that shows a single  degenerated disc at L4-5 and foraminal narrowing on the left side at L4-5.  It shows some scar tissue in the far lateral plane consistent with her previous operation.  The conus is normal.  Cauda equina is normal. DATA:                                                  The CT shows significant progression from October until December when she had the scan in the left L4-L5 disk in the far lateral space and extra spinal far lateral space.                  PHYSICAL EXAMINATION:                    On exam, 5/5 strength in both upper extremities.  Giveway weakness in the left hip flexors.  Dorsiflexion and plantar flexion is normal.  2+ reflexes at the knees and ankles.  Proprioception intact in both upper and lower extremities.  Light touch is intact.  She remains with a markedly antalgic gait.          IMPRESSION/PLAN:                             Risks and benefits she was well aware of, having had the operation.  She would like to proceed, and we will get her scheduled for 01/22.  I will try to move her up if I can, but she has that date.

## 2014-11-18 ENCOUNTER — Encounter (HOSPITAL_COMMUNITY): Payer: Self-pay | Admitting: Neurosurgery

## 2014-11-18 DIAGNOSIS — M5126 Other intervertebral disc displacement, lumbar region: Secondary | ICD-10-CM | POA: Diagnosis not present

## 2014-11-18 MED ORDER — MORPHINE SULFATE 15 MG PO TABS: 15.0000 mg | ORAL_TABLET | Freq: Four times a day (QID) | ORAL | Status: AC | PRN

## 2014-11-18 MED ORDER — DIAZEPAM 5 MG PO TABS: 5.0000 mg | ORAL_TABLET | Freq: Four times a day (QID) | ORAL | Status: AC | PRN

## 2014-11-18 MED ORDER — GABAPENTIN 300 MG PO CAPS: 300.0000 mg | ORAL_CAPSULE | Freq: Three times a day (TID) | ORAL | Status: AC

## 2014-11-18 NOTE — Discharge Summary (Signed)
Physician Discharge Summary  Patient ID: Claire Ritter MRN: [id number] DOB/AGE: [birth year] 44 y.o.  Admit date: 11/17/2014 Discharge date: 11/18/2014  Admission Diagnoses:lumbar herniated disc far lateral L4/5 left   Discharge Diagnoses: lumbar herniated disc far lateral L4/5 left  Active Problems:   HNP (herniated nucleus pulposus), lumbar   Discharged Condition: good  Hospital Course: Claire Ritter was admitted and taken to the operating room for an uncomplicated lumbar far lateral discetomy. Wound is clean, dry, and without signs of infection. She is ambulating, tolerating a regular diet, and voiding.   Treatments: surgery: LUMBAR LAMINECTOMY/DECOMPRESSION  Far lateral MICRODISCECTOMY 1 LEVEL Left L4/5   Discharge Exam: Blood pressure 116/58, pulse 84, temperature 98.5 F (36.9 C), temperature source Oral, resp. rate 16, SpO2 96 %. General appearance: alert, cooperative, appears stated age and mild distress Neurologic: Alert and oriented X 3, normal strength and tone. Normal symmetric reflexes. Normal coordination and gait  Disposition: Final discharge disposition not confirmed lumbar herniated disc    Medication List    TAKE these medications        diazepam 5 MG tablet  Commonly known as:  VALIUM  Take 5 mg by mouth every 6 (six) hours as needed for anxiety.     diazepam 5 MG tablet  Commonly known as:  VALIUM  Take 1 tablet (5 mg total) by mouth every 6 (six) hours as needed.     DULoxetine 30 MG capsule  Commonly known as:  CYMBALTA  Take 30 mg by mouth daily.     esomeprazole 40 MG capsule  Commonly known as:  NEXIUM  Take 40 mg by mouth daily at 12 noon.     estradiol 1 MG tablet  Commonly known as:  ESTRACE  Take 1 mg by mouth daily.     ezetimibe 10 MG tablet  Commonly known as:  ZETIA  Take 10 mg by mouth daily.     fentaNYL 25 MCG/HR patch  Commonly known as:  DURAGESIC - dosed mcg/hr  Place 25 mcg onto the skin every 3 (three) days.     ibuprofen 200 MG tablet  Commonly known as:  ADVIL,MOTRIN  Take 400 mg by mouth every 6 (six) hours as needed for mild pain.     morphine 15 MG tablet  Commonly known as:  MSIR  Take 1 tablet (15 mg total) by mouth every 6 (six) hours as needed for severe pain.     pseudoephedrine 30 MG tablet  Commonly known as:  SUDAFED  Take 60 mg by mouth every 4 (four) hours as needed for congestion.     rizatriptan 10 MG tablet  Commonly known as:  MAXALT  Take 10 mg by mouth as needed for migraine. May repeat in 2 hours if needed     rOPINIRole 0.5 MG tablet  Commonly known as:  REQUIP  Take 0.5 mg by mouth daily.     SYSTANE BALANCE OP  Place 1 drop into both eyes 2 (two) times daily as needed (for dry eyes).     topiramate 100 MG tablet  Commonly known as:  TOPAMAX  Take 100 mg by mouth 2 (two) times daily.     zolpidem 5 MG tablet  Commonly known as:  AMBIEN  Take 5 mg by mouth at bedtime.           Follow-up Information    Follow up with Marquiz Sotelo L, MD In 3 weeks.   Specialty:  Neurosurgery   Contact information:  [address] [place] [address] [phone]       Signed: Carmela HurtCABBELL,Willette Mudry L 11/18/2014, 10:03 AM

## 2014-11-18 NOTE — Discharge Instructions (Signed)
Lumbar Discectomy °Care After °A discectomy involves removal of discmaterial (the cartilage-like structures located between the bones of the back). It is done to relieve pressure on nerve roots. It can be used as a treatment for a back problem. The time in surgery depends on the findings in surgery and what is necessary to correct the problems. °HOME CARE INSTRUCTIONS  °· Check the cut (incision) made by the surgeon twice a day for signs of infection. Some signs of infection may include:  °· A foul smelling, greenish or yellowish discharge from the wound.  °· Increased pain.  °· Increased redness over the incision (operative) site.  °· The skin edges may separate.  °· Flu-like symptoms (problems).  °· A temperature above 101.5° F (38.6° C).  °· Change your bandages in about 24 to 36 hours following surgery or as directed.  °· You may shower tomrrow.  Avoid bathtubs, swimming pools and hot tubs for three weeks or until your incision has healed completely. °· Follow your doctor's instructions as to safe activities, exercises, and physical therapy.  °· Weight reduction may be beneficial if you are overweight.  °· Daily exercise is helpful to prevent the return of problems. Walking is permitted. You may use a treadmill without an incline. Cut down on activities and exercise if you have discomfort. You may also go up and down stairs as much as you can tolerate.  °· DO NOT lift anything heavier than 10 to 15 lbs. Avoid bending or twisting at the waist. Always bend your knees when lifting.  °· Maintain strength and range of motion as instructed.  °· Do not drive for 10 days, or as directed by your doctors. You may be a passenger . Lying back in the passenger seat may be more comfortable for you. Always wear a seatbelt.  °· Limit your sitting in a regular chair to 20 to 30 minutes at a time. There are no limitations for sitting in a recliner. You should lie down or walk in between sitting periods.  °· Only take  over-the-counter or prescription medicines for pain, discomfort, or fever as directed by your caregiver.  °SEEK MEDICAL CARE IF:  °· There is increased bleeding (more than a small spot) from the wound.  °· You notice redness, swelling, or increasing pain in the wound.  °· Pus is coming from wound.  °· You develop an unexplained oral temperature above 102° F (38.9° C) develops.  °· You notice a foul smell coming from the wound or dressing.  °· You have increasing pain in your wound.  °SEEK IMMEDIATE MEDICAL CARE IF:  °· You develop a rash.  °· You have difficulty breathing.  °· You develop any allergic problems to medicines given.  °Document Released: 09/18/2004 Document Revised: 10/03/2011 Document Reviewed: 01/07/2008 °ExitCare® Patient Information °

## 2014-11-18 NOTE — Progress Notes (Signed)
Pt doing well. Pt and husband given D/C instructions with Rx's, verbal understanding was provided. Pt's incision is clean and dry with no sign of infection. Pt's IV was removed prior to D/C. Pt D/C'd home via wheelchair @ 1040 per MD order. Pt is stable @ D/C and has no other needs at this time. Rema FendtAshley Eline Geng, RN

## 2014-11-18 NOTE — Anesthesia Postprocedure Evaluation (Signed)
  Anesthesia Post-op Note  Patient: Claire Ritter  Procedure(s) Performed: Procedure(s) with comments: LUMBAR LAMINECTOMY/DECOMPRESSION MICRODISCECTOMY 1 LEVEL (Left) - Left L45 far lateral microdiskeectomy  Patient Location: PACU  Anesthesia Type:General  Level of Consciousness: awake, alert , oriented and patient cooperative  Airway and Oxygen Therapy: Patient Spontanous Breathing  Post-op Pain: mild  Post-op Assessment: Post-op Vital signs reviewed, Patient's Cardiovascular Status Stable, Respiratory Function Stable, Patent Airway, No signs of Nausea or vomiting and Pain level controlled  Post-op Vital Signs: stable  Last Vitals:  Filed Vitals:   11/18/14 0748  BP: 116/58  Pulse: 84  Temp: 36.9 C  Resp: 16    Complications: No apparent anesthesia complications

## 2015-04-03 ENCOUNTER — Other Ambulatory Visit: Payer: Self-pay | Admitting: Neurosurgery

## 2015-04-03 DIAGNOSIS — M519 Unspecified thoracic, thoracolumbar and lumbosacral intervertebral disc disorder: Secondary | ICD-10-CM

## 2015-04-06 ENCOUNTER — Ambulatory Visit
Admission: RE | Admit: 2015-04-06 | Discharge: 2015-04-06 | Disposition: A | Discharge status: Home / Self Care | Payer: Medicare Other | Source: Ambulatory Visit | Attending: Neurosurgery | Admitting: Neurosurgery

## 2015-04-06 VITALS — BP 126/69 | HR 80

## 2015-04-06 DIAGNOSIS — M5126 Other intervertebral disc displacement, lumbar region: Secondary | ICD-10-CM

## 2015-04-06 DIAGNOSIS — M519 Unspecified thoracic, thoracolumbar and lumbosacral intervertebral disc disorder: Secondary | ICD-10-CM

## 2015-04-06 MED ORDER — IOHEXOL 180 MG/ML  SOLN
15.0000 mL | Freq: Once | INTRAMUSCULAR | Status: AC | PRN
  Administered 2015-04-06: 15 mL via INTRATHECAL

## 2015-04-06 MED ORDER — ONDANSETRON HCL 4 MG/2ML IJ SOLN
4.0000 mg | Freq: Once | INTRAMUSCULAR | Status: AC
  Administered 2015-04-06: 4 mg via INTRAMUSCULAR

## 2015-04-06 MED ORDER — HYDROMORPHONE HCL 2 MG/ML IJ SOLN
1.5000 mg | Freq: Once | INTRAMUSCULAR | Status: AC
  Administered 2015-04-06: 1.5 mg via INTRAMUSCULAR

## 2015-04-06 MED ORDER — ONDANSETRON HCL 4 MG/2ML IJ SOLN: 4.0000 mg | Freq: Four times a day (QID) | INTRAMUSCULAR | Status: DC | PRN

## 2015-04-06 MED ORDER — DIAZEPAM 5 MG PO TABS
10.0000 mg | ORAL_TABLET | Freq: Once | ORAL | Status: AC
  Administered 2015-04-06: 10 mg via ORAL

## 2015-04-06 NOTE — Discharge Instructions (Signed)
Myelogram Discharge Instructions  1. Go home and rest quietly for the next 24 hours.  It is important to lie flat for the next 24 hours.  Get up only to go to the restroom.  You may lie in the bed or on a couch on your back, your stomach, your left side or your right side.  You may have one pillow under your head.  You may have pillows between your knees while you are on your side or under your knees while you are on your back.  2. DO NOT drive today.  Recline the seat as far back as it will go, while still wearing your seat belt, on the way home.  3. You may get up to go to the bathroom as needed.  You may sit up for 10 minutes to eat.  You may resume your normal diet and medications unless otherwise indicated.  Drink lots of extra fluids today and tomorrow.  4. The incidence of headache, nausea, or vomiting is about 5% (one in 20 patients).  If you develop a headache, lie flat and drink plenty of fluids until the headache goes away.  Caffeinated beverages may be helpful.  If you develop severe nausea and vomiting or a headache that does not go away with flat bed rest, call [phone].  5. You may resume normal activities after your 24 hours of bed rest is over; however, do not exert yourself strongly or do any heavy lifting tomorrow. If when you get up you have a headache when standing, go back to bed and force fluids for another 24 hours.  6. Call your physician for a follow-up appointment.  The results of your myelogram will be sent directly to your physician by the following day.  7. If you have any questions or if complications develop after you arrive home, please call [phone].  Discharge instructions have been explained to the patient.  The patient, or the person responsible for the patient, fully understands these instructions.      May resume Cymbalta, Prozac, Tramadol, Doxepin, and Maxalt / Rizatriptan on April 07, 2015, after 11:00 am.

## 2015-04-06 NOTE — Progress Notes (Signed)
Patient states she has been off Cymbalta, Doxepin, Maxalt, Prozac and Tramadol for the past two days.

## 2017-01-12 IMAGING — CT CT L SPINE W/ CM
1 of 6 series · 6 of 14 positions shown, 8 images · non-contrast
Comparison: 09/28/2014 lumbar CT myelogram. 01/20/2015 lumbar spine
MRI from [facility]] Karuczka Karakai.

ADDENDUM:
Mrs. Pham called stating that she had been having persistent
headaches. However, she has been lying down and feels this morning
that these are much better. I advised her to lie down as much as she
could today, even though she was feeling better. I gave her the
phone number for the nurses at [HOSPITAL] if she was still
having symptoms tomorrow.
CLINICAL DATA: Low back and left greater than right lower extremity
pain.
TECHNIQUE: Contiguous axial images were obtained through the Lumbar spine after
the intrathecal infusion of contrast. Coronal and sagittal
reconstructions were obtained of the axial image sets.

[Series 3: l spine 3.0 b41s · axial · 0.29mm/px · z∈[-267,-105]mm · 6 of 76 slices shown, 8 images]
[im 11/76  soft-tissue]
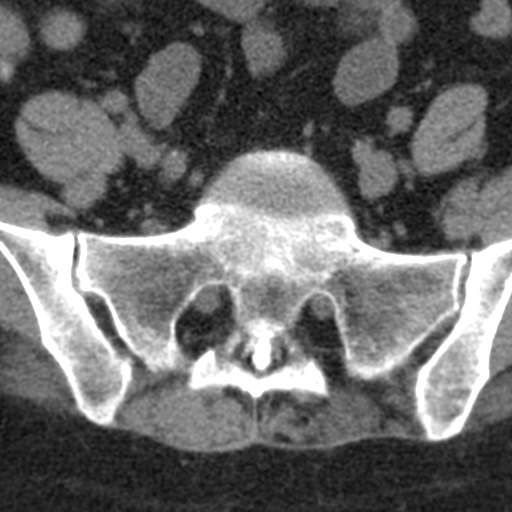
[im 11/76  bone]
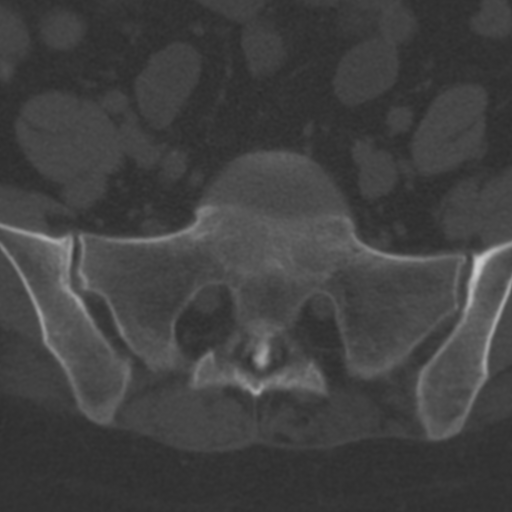
[im 22/76  bone]
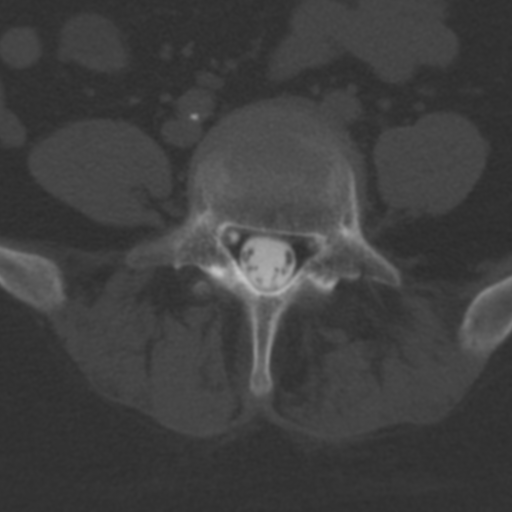
[im 33/76  bone]
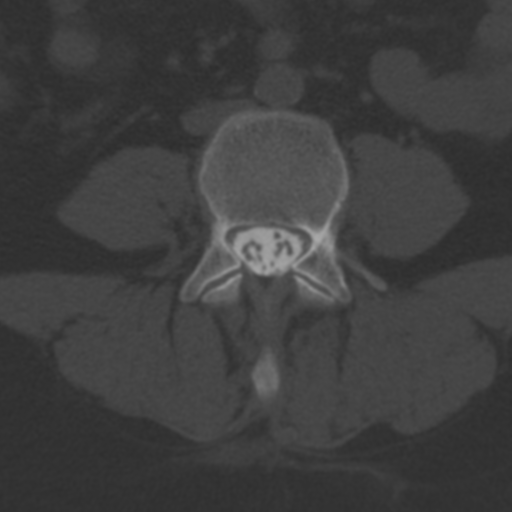
[im 43/76  bone]
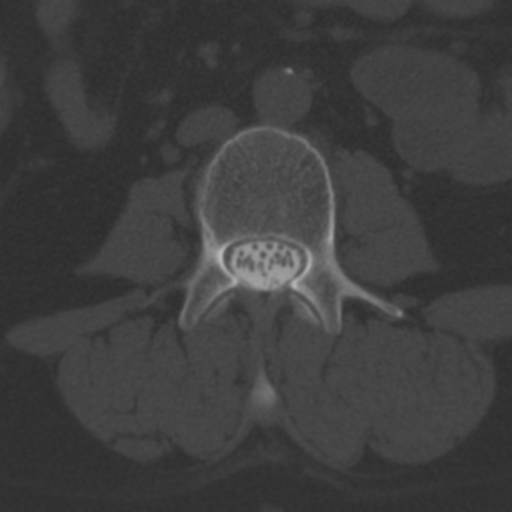
[im 54/76  soft-tissue]
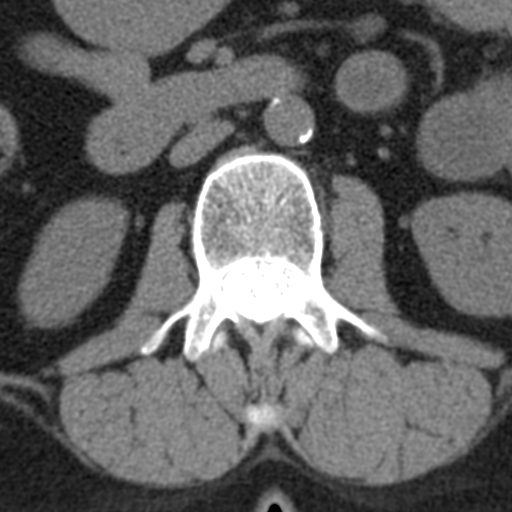
[im 54/76  bone]
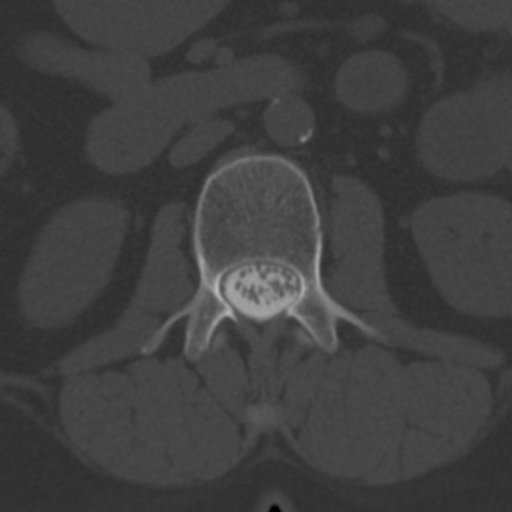
[im 65/76  bone]
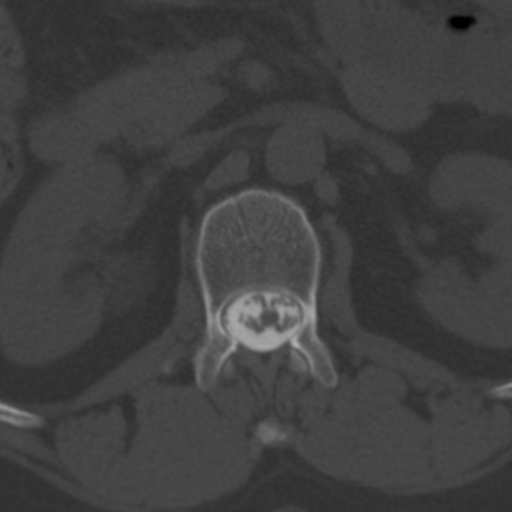

[6 of 14 positions shown; findings below may reference images not displayed]

EXAM:
LUMBAR MYELOGRAM

FLUOROSCOPY TIME:  Fluoroscopy Time (in minutes and seconds): 0
minutes 50 seconds

Number of Acquired Images:  12

PROCEDURE:
After thorough discussion of risks and benefits of the procedure
including bleeding, infection, injury to nerves, blood vessels,
adjacent structures as well as headache and CSF leak, written and
oral informed consent was obtained. Consent was obtained by Dr.
Rtoyota Joshjax. Time out form was completed.

Patient was positioned prone on the fluoroscopy table. Local
anesthesia was provided with 1% lidocaine without epinephrine after
prepped and draped in the usual sterile fashion. Puncture was
performed at L2-3 using a 3 1/2 inch 22-gauge spinal needle via a
right paramedian approach. Using a single pass through the dura, the
needle was placed within the thecal sac, with return of clear CSF.
15 mL of Jmnipaque-UA3 was injected into the thecal sac, with normal
opacification of the nerve roots and cauda equina consistent with
free flow within the subarachnoid space.

I personally performed the lumbar puncture and administered the
intrathecal contrast. I also personally supervised acquisition of
the myelogram images.
FINDINGS: LUMBAR MYELOGRAM FINDINGS:

Vertebral alignment is normal with upright neutral, flexion, and
extension positioning. There is a small ventral extradural defect at
L4-5 without stenosis. Nerve root sleeves appear symmetric and
unremarkable.

CT LUMBAR MYELOGRAM FINDINGS:

Vertebral alignment is normal paravertebral body heights are
preserved. Minimal disc space narrowing is present at L4-5. Disc
space heights are preserved elsewhere. Conus medullaris terminates
at L1. Mild aortoiliac atherosclerotic calcification is noted.

L1-2:  Negative.

L2-3:  Minimal leftward disc bulging without stenosis, unchanged.

L3-4: Asymmetric epidural fat in the left lateral recess on the
prior myelogram is much less prominent on the current examination
with only a slight impression on the thecal sac. There is minimal
disc bulging asymmetric to the left without stenosis, similar to the
prior study.

L4-5: Sequelae of interval left far-lateral microdiscectomy are
identified with an osteotomy involving the lateral aspect of the
left pars. Soft tissue within and lateral to the left neural foramen
likely represents postoperative granulation tissue, shown to be
enhancing on outside MRI. There is mild disc bulging and mild facet
hypertrophy without spinal stenosis. There is mild narrowing of the
left lateral recess due to ventral epidural soft tissue which is
likely mild bulging disc although there may be a component of
postoperative fibrosis as well contributing to this distortion.

L5-S1:  Negative.
IMPRESSION: 1. Postoperative changes at L4-5 with mild left lateral recess
narrowing as above.
2. No significant disc degeneration or stenosis elsewhere.

## 2022-03-10 ENCOUNTER — Other Ambulatory Visit: Payer: Self-pay

## 2022-03-10 ENCOUNTER — Encounter (HOSPITAL_COMMUNITY): Payer: Self-pay | Admitting: *Deleted

## 2022-03-10 ENCOUNTER — Emergency Department (HOSPITAL_COMMUNITY)
Admission: EM | Admit: 2022-03-10 | Discharge: 2022-03-10 | Disposition: A | Discharge status: Home / Self Care | Payer: Medicare Other | Attending: Emergency Medicine | Admitting: Emergency Medicine

## 2022-03-10 DIAGNOSIS — G43001 Migraine without aura, not intractable, with status migrainosus: Secondary | ICD-10-CM | POA: Insufficient documentation

## 2022-03-10 DIAGNOSIS — H53149 Visual discomfort, unspecified: Secondary | ICD-10-CM | POA: Diagnosis not present

## 2022-03-10 DIAGNOSIS — R110 Nausea: Secondary | ICD-10-CM | POA: Diagnosis not present

## 2022-03-10 DIAGNOSIS — R519 Headache, unspecified: Secondary | ICD-10-CM | POA: Diagnosis present

## 2022-03-10 DIAGNOSIS — G43901 Migraine, unspecified, not intractable, with status migrainosus: Secondary | ICD-10-CM

## 2022-03-10 MED ORDER — KETOROLAC TROMETHAMINE 30 MG/ML IJ SOLN
30.0000 mg | Freq: Once | INTRAMUSCULAR | Status: AC
  Administered 2022-03-10: 30 mg via INTRAMUSCULAR
  Filled 2022-03-10: qty 1

## 2022-03-10 MED ORDER — METOCLOPRAMIDE HCL 5 MG/ML IJ SOLN
10.0000 mg | Freq: Once | INTRAMUSCULAR | Status: AC
  Administered 2022-03-10: 10 mg via INTRAMUSCULAR
  Filled 2022-03-10: qty 2

## 2022-03-10 NOTE — ED Provider Notes (Signed)
?[facility] ?Provider Note ? ? ?CSN: [id number] ?Arrival date & time: 03/10/22  1354 ? ?  ? ?History ? ?Chief Complaint  ?Patient presents with  ? Migraine  ? ? ?BRISSA ASANTE is a 51 y.o. female. ? ? ?Migraine ?Patient has history of migraines and chronic pain.  Has had headache for the last 2 days.  Throbbing and like her typical migraines.  Has had some nausea.  Some photophobia.  Has taken Maxalt without relief.  States this is a typical headache for her.  No fevers or chills. ? ?  ? ?Home Medications ?Prior to Admission medications   ?Medication Sig Start Date End Date Taking? Authorizing Provider  ?diazepam (VALIUM) 5 MG tablet Take 5 mg by mouth every 6 (six) hours as needed for anxiety.    [provider]  ?diazepam (VALIUM) 5 MG tablet Take 1 tablet (5 mg total) by mouth every 6 (six) hours as needed. 11/18/14   Coletta Memos, MD  ?DULoxetine (CYMBALTA) 30 MG capsule Take 30 mg by mouth daily.    [provider]  ?esomeprazole (NEXIUM) 40 MG capsule Take 40 mg by mouth daily at 12 noon.    [provider]  ?estradiol (ESTRACE) 1 MG tablet Take 1 mg by mouth daily.    [provider]  ?ezetimibe (ZETIA) 10 MG tablet Take 10 mg by mouth daily.    [provider]  ?fentaNYL (DURAGESIC - DOSED MCG/HR) 25 MCG/HR patch Place 25 mcg onto the skin every 3 (three) days.    [provider]  ?gabapentin (NEURONTIN) 300 MG capsule Take 1 capsule (300 mg total) by mouth 3 (three) times daily. 11/18/14   Coletta Memos, MD  ?ibuprofen (ADVIL,MOTRIN) 200 MG tablet Take 400 mg by mouth every 6 (six) hours as needed for mild pain.    [provider]  ?morphine (MSIR) 15 MG tablet Take 1 tablet (15 mg total) by mouth every 6 (six) hours as needed for severe pain. 11/18/14   Coletta Memos, MD  ?Propylene Glycol (SYSTANE BALANCE OP) Place 1 drop into both eyes 2 (two) times daily as needed (for dry eyes).    [provider]   ?pseudoephedrine (SUDAFED) 30 MG tablet Take 60 mg by mouth every 4 (four) hours as needed for congestion.    [provider]  ?rizatriptan (MAXALT) 10 MG tablet Take 10 mg by mouth as needed for migraine. May repeat in 2 hours if needed    [provider]  ?rOPINIRole (REQUIP) 0.5 MG tablet Take 0.5 mg by mouth daily.    [provider]  ?topiramate (TOPAMAX) 100 MG tablet Take 100 mg by mouth 2 (two) times daily.    [provider]  ?zolpidem (AMBIEN) 5 MG tablet Take 5 mg by mouth at bedtime.    [provider]  ?   ? ?Allergies    ?Hydrocodone, Nucynta [tapentadol], and Oxycodone   ? ?Review of Systems   ?Review of Systems  ?Constitutional:  Positive for appetite change.  ?Gastrointestinal:  Positive for nausea.  ?Psychiatric/Behavioral:  The patient is nervous/anxious.   ? ?Physical Exam ?Updated Vital Signs ?BP 126/89 (BP Location: Left Arm)   Pulse 93   Temp 98.1 ?F (36.7 ?C) (Oral)   Resp 20   Ht 5\' 1"  (1.549 m)   Wt 74.4 kg   SpO2 98%   BMI 30.99 kg/m?  ?Physical Exam ?Vitals and nursing note reviewed.  ?HENT:  ?   Head: Atraumatic.  ?  Eyes:  ?   Comments: Patient is wearing sunglasses  ?Cardiovascular:  ?   Rate and Rhythm: Regular rhythm.  ?Musculoskeletal:  ?   Cervical back: Neck supple.  ?Skin: ?   General: Skin is warm.  ?Neurological:  ?   Mental Status: She is alert and oriented to person, place, and time.  ? ? ?ED Results / Procedures / Treatments   ?Labs ?(all labs ordered are listed, but only abnormal results are displayed) ?Labs Reviewed - No data to display ? ?EKG ?None ? ?Radiology ?No results found. ? ?Procedures ?Procedures  ? ? ?Medications Ordered in ED ?Medications  ?ketorolac (TORADOL) 30 MG/ML injection 30 mg (has no administration in time range)  ?metoCLOPramide (REGLAN) injection 10 mg (has no administration in time range)  ? ? ?ED Course/ Medical Decision Making/ A&P ?  ?                        ?Medical Decision  Making ?Risk ?Prescription drug management. ? ? ?Patient with history of migraines.  Typical migraine began around 2 days ago.  Unrelieved with her home medicines.  Do not feel the need further work-up at this time.  Most likely a migraine.  Patient request that she get her migraine cocktail states if she gets that I am she can go home after.  Does not feel she needs stay in the hospital.  I think this is reasonable and has had the cocktail before without any difficulty.  We will give Reglan and Toradol.  Discharge home with outpatient follow-up.  He is under some stress due to recent planning for surgery for her right hip. ?Doubt severe intracranial injury.  Doubt intracranial mass or hemorrhage ? ? ? ? ? ? ?Final Clinical Impression(s) / ED Diagnoses ?Final diagnoses:  ?Migraine with status migrainosus, not intractable, unspecified migraine type  ? ? ?Rx / DC Orders ?ED Discharge Orders   ? ? None  ? ?  ? ? ?  ?Benjiman Core, MD ?03/10/22 1604 ? ?

## 2022-03-10 NOTE — ED Triage Notes (Signed)
Pt with migraine HA x 2 days, has taken migraine medication with no relief.  Pt is to have a hip surgery soon and pt states she has been stressed.  ?

## 2023-08-11 ENCOUNTER — Encounter (HOSPITAL_COMMUNITY): Payer: Medicare Other

## 2023-08-15 ENCOUNTER — Encounter (HOSPITAL_COMMUNITY): Payer: Medicare Other | Attending: Pulmonary Disease

## 2023-08-19 ENCOUNTER — Encounter (HOSPITAL_COMMUNITY): Payer: Medicare Other

## 2024-08-05 NOTE — Progress Notes (Signed)
 [facility] Video Visit   Patient ID:  Claire Ritter is a 53 y.o. (DOB [birth year]) female Place of service: patient home Patient has been advised as to the limitations and limited nature of physical exam due to nature of a video visit, the possibility of privacy risk in the use of a video visit, and that the healthcare provider may recommend visiting a healthcare clinic for in-person care and follow up.   Video Visit Assessment and Plan   1. Osteoporosis, unspecified osteoporosis type, unspecified pathological fracture presence (Primary) -     Ambulatory referral to Obstetrics / Gynecology; Future 2. Gastroesophageal reflux disease without esophagitis 3. Age-related osteoporosis without current pathological fracture -     alendronate (FOSAMAX) 70 mg tablet; Take one tablet (70 mg dose) by mouth every 7 (seven) days., Starting Thu 08/05/2024, Normal 4. 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 History of compression fracture of spine -     Ambulatory referral to Obstetrics / Gynecology; Future 6. Current every day smoker 7. H/O insertion of nerve stimulator   Osteoporosis-get her started on Fosamax 70 mg weekly discussed taking the full glass of water an hour prior to eating and to sit up straight for an hour after taking medication discussed possible side effects plan to repeat bone density in 1 to 2 years to monitor discussed drug holiday with Fosamax after 5 years treatment.  Educated patient on how medication works.  She will also start taking calcium 1200 mg daily as well as vitamin D of 1000 international units daily and implement light weightbearing exercises.  Current smoker she is working on quitting completely currently taking Chantix has not completely quit  GERD stable at this time with Dexilant and Pepcid discussed following directions when taking Fosamax to avoid worsening GERD  Premature  surgical menopause on hormone replacement therapy still having hot flashes refer to [facility] gynecology  Keep routine scheduled follow-up with me in a month    Outpatient Encounter Medications as of 08/05/2024:  .  acetaminophen -codeine (TYLENOL  #3) 300-30 mg per tablet, Take one tablet by mouth every 4 (four) hours as needed for Pain. SABRA  albuterol sulfate HFA (PROVENTIL,VENTOLIN,PROAIR) 108 (90 Base) MCG/ACT inhaler, USE 1 INHALATION BY MOUTH EVERY  4 HOURS AS NEEDED FOR SHORTNESS  OF BREATH OR WHEEZING .  alendronate (FOSAMAX) 70 mg tablet, Take one tablet (70 mg dose) by mouth every 7 (seven) days. .  Blood Glucose Monitoring Suppl (ONETOUCH VERIO FLEX SYSTEM) w/Device KIT,  .  Blood Glucose Monitoring Suppl (ONETOUCH VERIO REFLECT) w/Device KIT, USE TO TEST FOUR TIMES DAILY .  clobetasol propionate (TEMOVATE) 0.05% cream, Apply topically 2 (two) times daily. SABRA  DEXILANT 60 MG capsule, Take one capsule (60 mg dose) by mouth daily. .  DULoxetine  HCl (CYMBALTA ) 60 mg capsule, Take one capsule (60 mg dose) by mouth every morning. .  erenumab-aooe (AIMOVIG) 70 MG/ML SOAJ injection, Inject 1 mL (70 mg dose) into the skin every 30 (thirty) days. .  estradiol  (ESTRACE ) 0.5 mg tablet, TAKE 1 TABLET(0.5 MG) BY MOUTH DAILY .  famotidine (PEPCID) 40 mg tablet, TAKE 1 TABLET BY MOUTH TWICE  DAILY .  fenofibrate 145 MG tablet, TAKE 1 TABLET BY MOUTH DAILY .  Fluticasone Furoate (ARNUITY ELLIPTA) 100 MCG/ACT AEPB, Inhale one puff into the lungs once for 1 dose. (Patient taking differently: Inhale one puff into the lungs as needed.) .  fluticasone propionate (FLONASE)  50 mcg/actuation nasal spray, USE 1 SPRAY NASALLY DAILY .  fluticasone propionate (FLONASE) 50 mcg/actuation nasal spray, one spray by Nasal route daily. SABRA  glucose blood (ONETOUCH VERIO) test strip, USE TO CHECK BLOOD SUGAR TWICE  DAILY .  lidocaine  (LIDODERM ) 5%, UNWRAP AND APPLY 1 PATCH TO SKIN DAILY. LEAVE PATCH ON FOR 12 HOURS THEN OFF FOR  12 HOURS .  mirtazapine (REMERON) 15 mg tablet, Take one tablet (15 mg dose) by mouth at bedtime. .  montelukast (SINGULAIR) 10 MG tablet, TAKE 1 TABLET BY MOUTH DAILY .  ondansetron  (ZOFRAN -ODT) 4 mg disintegrating tablet, Take one tablet (4 mg dose) by mouth every 8 (eight) hours as needed. .  pioglitazone (ACTOS) 15 MG tablet, Take one tablet (15 mg dose) by mouth daily. SABRA  REPATHA SURECLICK 140 MG/ML SOAJ Pen, INJECT 1 PEN SUBCUTANEOUSLY  EVERY 2 WEEKS .  rizatriptan (MAXALT) 10 MG tablet, TAKE 1 TABLET BY MOUTH AS NEEDED FOR MIGRAINE(S) .  ropinirole  (REQUIP ) 3 MG tablet, Take one tablet (3 mg dose) by mouth at bedtime. .  scopolamine  (TRANSDERM-SCOP 1.5 MG) 1 MG/3DAYS patch, Place one patch onto the skin every third day. .  tirzepatide (MOUNJARO) 10 mg/0.5 mL SOAJ injection, Inject 0.5 mLs (10 mg dose) into the skin once a week. .  topiramate  (TOPAMAX ) 50 MG tablet, Take one tablet (50 mg dose) by mouth 2 (two) times daily. SABRA  triamcinolone (KENALOG) 0.5% ointment, Apply topically 2 (two) times daily. SABRA  triamcinolone 0.5% cream-eucerin cream (1:1), Apply one Application topically 2 (two) times daily. SABRA  umeclidinium-vilanterol (ANORO ELLIPTA) 62.5-25 mcg/inh diskus inhaler, Inhale one puff into the lungs daily. (Patient taking differently: Inhale one puff into the lungs as needed.) .  varenicline (CHANTIX CONTINUING MONTH PAK) 1 mg tablet, Take one tablet (1 mg dose) by mouth 2 (two) times daily. For 2 months  Facility-Administered Encounter Medications as of 08/05/2024:  .  triamcinolone acetonide (KENALOG-40) 40 mg/mL injection 40 mg, 40 mg   Risk, benefits, and alternatives were provided through patient instructions given to the patient electronically and during the video interaction.  If any worsening symptoms or lack of improvement, the patient will seek immediate medical care.   Video Visit History  No chief complaint on file.   Follow-up to discuss recent results of bone  density test.  She is a smoker has had history of long-term steroid use as well as premature surgical menopause at age 5 on hormone replacement therapy as well as history of compression fracture of her spine.  She also has family history of osteoporosis with her mother.  This is her first bone density test and she is wondering why she did not have it earlier.  She recently established care with me June 2025 prior to that was followed by Lauraine Burkes.  She is also followed by Dr. Beryl endocrinology.  As well as pulmonologist.  She reports after having her hysterectomy she has been on and off hormone treatments she continues to have hot flashes.  She is still taking estradiol .  She was never told to have bone density test and even when she apparently had a fracture of her thoracic spine from coughing she did inquire about it but she was told she did not need it that she was too young    Reviewed and updated this visit by provider: Tobacco  Allergies  Meds  Problems  Med Hx  Surg Hx  Fam Hx        ROS:  As documented in the history above, all other relevant system complaints were negative.    Video Visit Objective Findings   Examination conducted with the use of video cameras/computer monitors. Vital signs and other aspects of physical exam are limited due to the nature of this encounter.   Constitutional:  No apparent acute distress noted during the video interaction; Alert and oriented with normal mentation and verbally interactive. Mood:  Appears appropriate to situation.

## 2024-09-03 NOTE — Progress Notes (Signed)
 Impression   1. Chronic obstructive pulmonary disease, unspecified COPD type (*)  Misc. Devices MISC    2. Cigarette smoker  Misc. Devices MISC    3. Diabetes mellitus type 2 without retinopathy (*)  POCT A1C   POCT A1C    4. Dyslipidemia associated with type 2 diabetes mellitus (*)  Comprehensive Metabolic Panel   Lipid Panel With LDL/HDL Ratio   Lipid Panel With LDL/HDL Ratio   Comprehensive Metabolic Panel    5. Statin intolerance      6. Gastroesophageal reflux disease without esophagitis      7. Iron deficiency anemia secondary to inadequate dietary iron intake      8. Moderate episode of recurrent major depressive disorder (*)      9. Chronic bilateral low back pain with bilateral sciatica      10. Primary osteoarthritis of right hip      11. Fibromyalgia      12. Migraine with aura and without status migrainosus, not intractable      13. OSA (obstructive sleep apnea)  Ambulatory referral to Pulmonology    14. Insomnia, unspecified type        Assessment & Plan  Plan   COPD stable she  uses her inhalers as needed.  Followed by pulmonology reviewed notes from July 2025  Cigarette smoker currently taking Chantix she has cut down from 2 packs a day to about half a pack a day she will continue with Chantix and work towards quitting completely  Restless leg syndrome stable with Requip  at bedtime  GERD stable with Dexilant and Pepcid  Abdominal hernia has a follow-up in a week with gastroenterology plans to discuss with them recently was evaluated urgent care for this  Migraines stable with Aimovig and Zofran  as needed, Maxalt as needed, Topamax   Seasonal allergies stable with Singulair Flonase  Hyperlipidemia associate with type 2 diabetes check lipids stable with Repatha and fenofibrate statin intolerance continue to encourage Mediterranean diet regular cardio exercise  Type 2 diabetes recent A1c elevated 8% range she states she has been taking quite a bit  of steroids recently.  Was seen by Dr. 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 DTaP/Tdap/Td Vaccines (1 - Tdap) Never done  . Hepatitis A Vaccine (1 of 2 - Risk 2-dose series) Never done    See encounter after visit summary for patient specific instructions.  Follow up in about 3 months (around 12/04/2024).   Orders Placed This Encounter  Procedures  . Comprehensive Metabolic Panel  . Lipid Panel With LDL/HDL Ratio  . Ambulatory referral to Pulmonology  . POCT A1C     Patient's Medications       *  Accurate as of September 03, 2024 10:27 AM. Reflects encounter med changes as of last refresh          New Prescriptions      Instructions  Misc. Devices Misc Started by: Comer Reid  RSV vaccine       Continued Medications      Instructions  acetaminophen -codeine 300-30 mg per tablet Commonly known as: TYLENOL  #3  1 tablet, Every 4 hours as needed   AIMOVIG 70 MG/ML Soaj injection Generic drug: erenumab-aooe  70 mg, Subcutaneous, Every 30 days   albuterol sulfate HFA 108 (90  Base) MCG/ACT inhaler Commonly known as: PROVENTIL,VENTOLIN,PROAIR  USE 1 INHALATION BY MOUTH EVERY  4 HOURS AS NEEDED FOR SHORTNESS  OF BREATH OR WHEEZING   alendronate 70 mg tablet Commonly known as: FOSAMAX  70 mg, Oral, Every 7 days   clobetasol propionate 0.05% cream Commonly known as: TEMOVATE  Topical, 2 times a day   DEXILANT 60 MG capsule Generic drug: dexlansoprazole  60 mg, Oral, Daily   DULoxetine  HCl 60 mg capsule Commonly known as: CYMBALTA   60 mg, Every morning   estradiol  0.5 mg tablet Commonly known as: ESTRACE   0.5 mg, Oral, Daily, 0   famotidine 40 mg tablet Commonly known as: PEPCID  40 mg, Oral, 2 times a day   fenofibrate 145 MG tablet  145 mg, Oral, Daily   * fluticasone propionate 50 mcg/actuation nasal spray Commonly known as: FLONASE  USE 1 SPRAY NASALLY DAILY   * fluticasone propionate 50 mcg/actuation nasal spray Commonly known as: FLONASE  1 spray, Nasal, Daily   lidocaine  5% Commonly known as: LIDODERM   UNWRAP AND APPLY 1 PATCH TO SKIN DAILY. LEAVE PATCH ON FOR 12 HOURS THEN OFF FOR 12 HOURS   montelukast 10 MG tablet Commonly known as: SINGULAIR  10 mg, Oral, Daily   MOUNJARO 10 MG/0.5ML Soaj injection Generic drug: tirzepatide  10 mg, Subcutaneous, Weekly   ondansetron  4 mg disintegrating tablet Commonly known as: ZOFRAN -ODT  4 mg, Oral, Every 8 hours as needed   * ONETOUCH VERIO FLEX SYSTEM w/Device Kit  No dose, route, or frequency recorded.   * ONETOUCH VERIO REFLECT w/Device Kit  USE TO TEST FOUR TIMES DAILY   ONETOUCH VERIO test strip Generic drug: glucose blood  USE TO CHECK BLOOD SUGAR TWICE  DAILY   pioglitazone 15 MG tablet Commonly known as: ACTOS  15 mg, Oral, Daily   REPATHA SURECLICK 140 MG/ML Soaj Pen Generic drug: evolocumab  INJECT 1 PEN SUBCUTANEOUSLY  EVERY 2 WEEKS   rizatriptan 10 MG tablet Commonly known as: MAXALT  TAKE 1 TABLET BY MOUTH AS NEEDED FOR MIGRAINE(S)   ropinirole  3 MG  tablet Commonly known as: REQUIP   3 mg, Oral, At bedtime   scopolamine  1 MG/3DAYS patch Commonly known as: TRANSDERM-SCOP 1.5 MG  1 patch, Transdermal, Every 72 hours   topiramate  50 MG tablet Commonly known as: TOPAMAX   50 mg, Oral, 2 times a day   triamcinolone 0.5% ointment Commonly known as: KENALOG  Topical, 2 times a day   triamcinolone-eucerin cream Commonly known as: KENALOG:EUCERIN  1 Application, Topical, 2 times a day   varenicline 1 mg tablet Commonly known as: CHANTIX CONTINUING MONTH PAK  1 mg, Oral, 2 times a day, For 2 months      * * This list has 4 medication(s) that are the same as other medications prescribed for you. Read the directions carefully, and ask your doctor or other care provider to  review them with you.          Modified Medications      Instructions  ANORO ELLIPTA 62.5-25 MCG/ACT diskus inhaler Generic drug: umeclidinium-vilanterol What changed:  when to take this reasons to take this  1 puff, Inhalation, Daily   ARNUITY ELLIPTA 100 MCG/ACT Aepb Generic drug: Fluticasone Furoate What changed:  when to take this reasons to take this  1 puff, Inhalation, Once       Discontinued Medications    mirtazapine 15 mg tablet Commonly known as: REMERON Stopped by: Comer Reid        Risks, benefits, and alternatives of the medications and treatment plan prescribed today were discussed, and patient expressed understanding. Plan follow-up as discussed or as needed if any worsening symptoms or change in condition.    A yearly health maintenance exam was recommended where appropriate.     Subjective   Patient ID:  Claire Ritter is a 53 y.o. (DOB 13-Jan-1971) female.   Chief Complaint  Patient presents with  . 3 month follow up    non-fasting     History of Present Illness   Results   Comes in for routine follow-up today her husband is present as well for chronic disease state management recently started on  Fosamax and diagnosed with osteoporosis we discussed this via telemedicine visit a few weeks ago no issues or side effects so far.  Recently evaluated by Dr. Beryl endocrinology A1c in the 8% range she reports this is related to taking steroids recently  She is trying to quit smoking taking Chantix down to half a pack a day  Cholesterol elevated last visit admittedly was not taking Repatha consistently admits she still probably taking about every 3 weeks not always every 2 weeks  Reports pain in her abdomen evaluated at urgent care recently diagnosed with hernia says she has an appointment with gastroenterology to discuss this next week.  She thinks she might have caused it when she was lifting her 200 pound dog a few weeks ago  Problem List, Past Medical History, Past Surgical History, Past Family History, Social History, Medications, and Allergies, were reviewed and updated as appropriate.   Review of Systems Review of Systems  Constitutional:  Negative for activity change, appetite change, fatigue and unexpected weight change.  Eyes:  Negative for visual disturbance.  Respiratory:  Negative for cough and shortness of breath.   Cardiovascular:  Negative for chest pain, palpitations and leg swelling.  Gastrointestinal:  Positive for abdominal pain.  Genitourinary:  Negative for dysuria.  Skin:  Negative for color change, rash and wound.  Neurological:  Negative for headaches.  Psychiatric/Behavioral:  Negative for dysphoric mood. The patient is not nervous/anxious.      Objective   BP 118/70 (BP Location: Left Upper Arm, Patient Position: Sitting)   Pulse 73   Temp 97.9 F (36.6 C) (Temporal)   Resp 18   Ht 5' 1.5 (1.562 m)   Wt 157 lb (71.2 kg)   LMP  (LMP Unknown)   SpO2 95%   BMI 29.18 kg/m  Physical Exam  Physical Exam Constitutional:      Appearance: Normal appearance. She is well-developed.  Neck:     Vascular: No carotid bruit.  Cardiovascular:     Rate and  Rhythm: Normal rate and regular rhythm.  Pulmonary:     Effort: Pulmonary effort is normal.     Breath sounds: Normal breath sounds.  Abdominal:     General: Bowel  sounds are normal.     Palpations: Abdomen is soft.  Skin:    General: Skin is warm and dry.  Neurological:     Mental Status: She is alert and oriented to person, place, and time.  Psychiatric:        Mood and Affect: Mood normal.    --------------------------- Labs last 12 hours No results found for this or any previous visit (from the past 2 weeks).  Attestation   *Some images could not be shown.

## 2024-09-07 ENCOUNTER — Emergency Department (HOSPITAL_COMMUNITY)
Admission: EM | Admit: 2024-09-07 | Discharge: 2024-09-07 | Disposition: A | Discharge status: Home / Self Care | Attending: Emergency Medicine | Admitting: Emergency Medicine

## 2024-09-07 ENCOUNTER — Other Ambulatory Visit: Payer: Self-pay

## 2024-09-07 ENCOUNTER — Encounter (HOSPITAL_COMMUNITY): Payer: Self-pay | Admitting: *Deleted

## 2024-09-07 ENCOUNTER — Emergency Department (HOSPITAL_COMMUNITY)

## 2024-09-07 DIAGNOSIS — E039 Hypothyroidism, unspecified: Secondary | ICD-10-CM | POA: Insufficient documentation

## 2024-09-07 DIAGNOSIS — E119 Type 2 diabetes mellitus without complications: Secondary | ICD-10-CM | POA: Insufficient documentation

## 2024-09-07 DIAGNOSIS — R1033 Periumbilical pain: Secondary | ICD-10-CM | POA: Diagnosis not present

## 2024-09-07 DIAGNOSIS — R109 Unspecified abdominal pain: Secondary | ICD-10-CM | POA: Diagnosis present

## 2024-09-07 LAB — CBC WITH DIFFERENTIAL/PLATELET
Abs Immature Granulocytes: 0.02 K/uL (ref 0.00–0.07)
Basophils Absolute: 0.1 K/uL (ref 0.0–0.1)
Basophils Relative: 1 %
Eosinophils Absolute: 0.2 K/uL (ref 0.0–0.5)
Eosinophils Relative: 2 %
HCT: 45 % (ref 36.0–46.0)
Hemoglobin: 15.3 g/dL — ABNORMAL HIGH (ref 12.0–15.0)
Immature Granulocytes: 0 %
Lymphocytes Relative: 45 %
Lymphs Abs: 4.5 K/uL — ABNORMAL HIGH (ref 0.7–4.0)
MCH: 29.9 pg (ref 26.0–34.0)
MCHC: 34 g/dL (ref 30.0–36.0)
MCV: 88.1 fL (ref 80.0–100.0)
Monocytes Absolute: 0.5 K/uL (ref 0.1–1.0)
Monocytes Relative: 5 %
Neutro Abs: 4.8 K/uL (ref 1.7–7.7)
Neutrophils Relative %: 47 %
Platelets: 342 K/uL (ref 150–400)
RBC: 5.11 MIL/uL (ref 3.87–5.11)
RDW: 12.8 % (ref 11.5–15.5)
WBC: 10.1 K/uL (ref 4.0–10.5)
nRBC: 0 % (ref 0.0–0.2)

## 2024-09-07 LAB — COMPREHENSIVE METABOLIC PANEL WITH GFR
ALT: 20 U/L (ref 0–44)
AST: 21 U/L (ref 15–41)
Albumin: 4.4 g/dL (ref 3.5–5.0)
Alkaline Phosphatase: 63 U/L (ref 38–126)
Anion gap: 12 (ref 5–15)
BUN: 10 mg/dL (ref 6–20)
CO2: 24 mmol/L (ref 22–32)
Calcium: 9.6 mg/dL (ref 8.9–10.3)
Chloride: 105 mmol/L (ref 98–111)
Creatinine, Ser: 0.51 mg/dL (ref 0.44–1.00)
GFR, Estimated: >60 mL/min (ref >60)
Glucose, Bld: 107 mg/dL — ABNORMAL HIGH (ref 70–99)
Potassium: 4 mmol/L (ref 3.5–5.1)
Sodium: 142 mmol/L (ref 135–145)
Total Bilirubin: 0.3 mg/dL (ref 0.0–1.2)
Total Protein: 7 g/dL (ref 6.5–8.1)

## 2024-09-07 LAB — URINALYSIS, ROUTINE W REFLEX MICROSCOPIC
Bilirubin Urine: NEGATIVE
Glucose, UA: NEGATIVE mg/dL
Hgb urine dipstick: NEGATIVE
Ketones, ur: NEGATIVE mg/dL
Leukocytes,Ua: NEGATIVE
Nitrite: NEGATIVE
Protein, ur: NEGATIVE mg/dL
Specific Gravity, Urine: 1.012 (ref 1.005–1.030)
pH: 5 (ref 5.0–8.0)

## 2024-09-07 LAB — LIPASE, BLOOD: Lipase: 37 U/L (ref 11–51)

## 2024-09-07 MED ORDER — METOCLOPRAMIDE HCL 10 MG PO TABS
10.0000 mg | ORAL_TABLET | Freq: Once | ORAL | Status: AC
  Administered 2024-09-07: 10 mg via ORAL
  Filled 2024-09-07: qty 1

## 2024-09-07 MED ORDER — LIDOCAINE 5 % EX PTCH: 1.0000 | MEDICATED_PATCH | CUTANEOUS | 0 refills | Status: AC

## 2024-09-07 NOTE — ED Provider Notes (Cosign Needed Addendum)
 [place] EMERGENCY DEPARTMENT AT [facility] Provider Note   CSN: [id number] Arrival date & time: 09/07/24  1256     Patient presents with: Abdominal Pain   Claire Ritter is a 53 y.o. female.  She has history of diabetes, high cholesterol, GERD, hypothyroidism.  Presents ER today for pain with a knot in the right mid abdomen.  This started about 2 weeks ago and states it is uncomfortable, she is having a lot of nausea.  She went to urgent care and her PCP and they told her it was most likely a hernia, she states she is waiting for her PCP to refer her to a surgeon but this has not happened yet, wanted eval today because of symptoms are persistent and slightly worsening.  She states when she stands up she notices some bulging in this area.  She thinks it started when she lifted her 200 pound great Dane at the vet.  She states she has been dealing with constipation, though relates this to her diabetes medication.  She has been taking Tylenol  3 for her chronic back pain and Zofran  for the nausea at home.  Symptoms are worse with standing and straining, better with lying back.    Abdominal Pain      Prior to Admission medications   Medication Sig Start Date End Date Taking? Authorizing Provider  diazepam  (VALIUM ) 5 MG tablet Take 5 mg by mouth every 6 (six) hours as needed for anxiety.    [provider]  diazepam  (VALIUM ) 5 MG tablet Take 1 tablet (5 mg total) by mouth every 6 (six) hours as needed. 11/18/14   Gillie Duncans, MD  DULoxetine  (CYMBALTA ) 30 MG capsule Take 30 mg by mouth daily.    [provider]  esomeprazole (NEXIUM) 40 MG capsule Take 40 mg by mouth daily at 12 noon.    [provider]  estradiol  (ESTRACE ) 1 MG tablet Take 1 mg by mouth daily.    [provider]  ezetimibe  (ZETIA ) 10 MG tablet Take 10 mg by mouth daily.    [provider]  fentaNYL  (DURAGESIC  - DOSED MCG/HR) 25 MCG/HR patch Place 25 mcg onto the skin  every 3 (three) days.    [provider]  gabapentin  (NEURONTIN ) 300 MG capsule Take 1 capsule (300 mg total) by mouth 3 (three) times daily. 11/18/14   Cabbell, Kyle, MD  ibuprofen (ADVIL,MOTRIN) 200 MG tablet Take 400 mg by mouth every 6 (six) hours as needed for mild pain.    [provider]  morphine  (MSIR) 15 MG tablet Take 1 tablet (15 mg total) by mouth every 6 (six) hours as needed for severe pain. 11/18/14   Gillie Duncans, MD  Propylene Glycol (SYSTANE BALANCE OP) Place 1 drop into both eyes 2 (two) times daily as needed (for dry eyes).    [provider]  pseudoephedrine  (SUDAFED) 30 MG tablet Take 60 mg by mouth every 4 (four) hours as needed for congestion.    [provider]  rizatriptan (MAXALT) 10 MG tablet Take 10 mg by mouth as needed for migraine. May repeat in 2 hours if needed    [provider]  rOPINIRole  (REQUIP ) 0.5 MG tablet Take 0.5 mg by mouth daily.    [provider]  topiramate  (TOPAMAX ) 100 MG tablet Take 100 mg by mouth 2 (two) times daily.    [provider]  zolpidem  (AMBIEN ) 5 MG tablet Take 5 mg by mouth at bedtime.  [provider]    Allergies: Hydrocodone, Nucynta [tapentadol], and Oxycodone    Review of Systems  Gastrointestinal:  Positive for abdominal pain.    Updated Vital Signs BP (!) 156/84 (BP Location: Right Arm)   Pulse 79   Temp 98.1 F (36.7 C) (Oral)   Resp 20   Ht 5' 1 (1.549 m)   Wt 70.8 kg   SpO2 100%   BMI 29.48 kg/m   Physical Exam Vitals and nursing note reviewed.  Constitutional:      General: She is not in acute distress.    Appearance: She is well-developed.  HENT:     Head: Normocephalic and atraumatic.  Eyes:     Conjunctiva/sclera: Conjunctivae normal.  Cardiovascular:     Rate and Rhythm: Normal rate and regular rhythm.     Heart sounds: No murmur heard. Pulmonary:     Effort: Pulmonary effort is normal. No respiratory distress.      Breath sounds: Normal breath sounds.  Abdominal:     Palpations: Abdomen is soft.     Tenderness: There is abdominal tenderness in the periumbilical area.     Hernia: No hernia is present. There is no hernia in the ventral area.     Comments: Right periumbilical area has tender area that is hard to touch, it does seem to still worsen with sitting up and standing but does not definitively feel like a hernia, it is tender to palpation but does not feel like anything to reduce.  There are no overlying skin changes  Musculoskeletal:        General: No swelling.     Cervical back: Neck supple.  Skin:    General: Skin is warm and dry.     Capillary Refill: Capillary refill takes less than 2 seconds.  Neurological:     Mental Status: She is alert.  Psychiatric:        Mood and Affect: Mood normal.     (all labs ordered are listed, but only abnormal results are displayed) Labs Reviewed  CBC WITH DIFFERENTIAL/PLATELET  COMPREHENSIVE METABOLIC PANEL WITH GFR  LIPASE, BLOOD  URINALYSIS, ROUTINE W REFLEX MICROSCOPIC    EKG: None  Radiology: No results found.   Procedures   Medications Ordered in the ED - No data to display                                  Medical Decision Making This patient presents to the ED for concern of pain in right mid abdomen with a knot concern for possible hernia, this involves an extensive number of treatment options, and is a complaint that carries with it a high risk of complications and morbidity.  The differential diagnosis includes hernia, incarcerated hernia, strangulated hernia, muscle spasm, strain, other   Co morbidities that complicate the patient evaluation  Diabetes   Additional history obtained:  Additional history obtained from EMR External records from outside source obtained and reviewed including recent family medicine note for abdominal wall pain, prior labs   Lab Tests:  I Ordered, and personally interpreted labs.  The  pertinent results include: CBC with no leukocytosis or anemia, CMP normal, UA normal, lipase normal   Imaging Studies ordered:  I ordered imaging studies including CT abdomen pelvis I independently visualized and interpreted imaging which showed hepatic steatosis, no hernia or other acute intra-abdominal findings I agree with the radiologist interpretation  Problem List / ED Course / Critical interventions / Medication management  Abdominal wall pain with a knot.  Ongoing for 2 weeks and started after lifting a very heavy dog, patient concern for hernia.  Has been taking Tylenol  3 at home as she cannot take hydrocodone or oxycodone.  She also has been taking muscle laxer's without relief.  She states she is having some nausea and decreased p.o. intake that she relates to taking her Trulicity and has been taking Zofran  at home.  Given Reglan  here with some mild relief of her nausea.  She appeared to be comfortable exam except for when I was palpating the area of concern on her abdomen.  There is a patient will get labs and a CT.  Labs reassuring, CT also reassuring did not show hernia or any acute this.  Did show hepatic steatosis with patient advised to follow-up with GI for this. I was called to patient's bedside just after CT head resulted, patient states she wants to leave and take her Tylenol  3 for home.  Offered pain medicine here she does have an IV but she states she does not want to take it longer, is frustrated with lack of definitive diagnosis.  I discussed with her that she likely has a strain of her abdominal wall with associated muscle spasm, she did not want any further antiemetics as she has Zofran  at home and feels that is adequate, I prescribed lidocaine  patches, she states she already has muscle relaxers and Tylenol  3 and cannot take other narcotics.  Advised on follow-up and strict return precautions.  I have reviewed the patients home medicines and have made adjustments as  needed      Amount and/or Complexity of Data Reviewed Labs: ordered. Radiology: ordered.  Risk Prescription drug management.        Final diagnoses:  None    ED Discharge Orders     None          Suellen Sherran LABOR, PA-C 09/07/24 1707    [address] PA-C 09/07/24 1709    Freddi Hamilton, MD 09/08/24 [phone]

## 2024-09-07 NOTE — ED Notes (Signed)
 Patient transported to CT

## 2024-09-07 NOTE — Discharge Instructions (Addendum)
 You were evaluated in the emergency room today for pain and a knot in your abdominal wall for the past couple of weeks.  This has been ongoing for the ordered CT scan for rule out hernia or other possible cause, your CT scan showed a fatty liver, make sure to follow-up with GI for this, but no hernia or other explanation of the knot in your abdominal wall.  Is likely that you pulled a muscle when lifting your dog and are having some spasm.  Continue taking your Tylenol  3 as needed for pain, Zofran  as needed for nausea and follow-up closely with primary care and GI.  Come back to the ER if you have new or worsening symptoms.  I have also prescribed lidocaine  patches and you can buy an over-the-counter support belt which may be of help when you are standing.  Avoid any heavy lifting.

## 2024-09-07 NOTE — ED Notes (Signed)
 Patient return from CT

## 2024-09-07 NOTE — ED Triage Notes (Signed)
 Pt with abd pain x 2 weeks, seen PCP and UC, was told she has a hernia.  Pt states she is not able due to nausea. Pt needs referral to surgeon. Took tylenol   #3 2 hours ago.
